# Patient Record
Sex: Male | Born: 1960 | Race: White | Hispanic: No | Marital: Married | Smoking: Former smoker
Health system: Southern US, Community
[De-identification: ages and names within clinical notes are randomized; demographics above are authoritative.]

## PROBLEM LIST (undated history)

## (undated) DIAGNOSIS — E119 Type 2 diabetes mellitus without complications: Secondary | ICD-10-CM

## (undated) DIAGNOSIS — Z87442 Personal history of urinary calculi: Secondary | ICD-10-CM

## (undated) DIAGNOSIS — F32A Depression, unspecified: Secondary | ICD-10-CM

## (undated) DIAGNOSIS — F329 Major depressive disorder, single episode, unspecified: Secondary | ICD-10-CM

## (undated) DIAGNOSIS — M1990 Unspecified osteoarthritis, unspecified site: Secondary | ICD-10-CM

## (undated) HISTORY — PX: BACK SURGERY: SHX140

---
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
## 2019-09-19 ENCOUNTER — Ambulatory Visit: Payer: Self-pay | Admitting: Orthopedic Surgery

## 2019-10-07 ENCOUNTER — Ambulatory Visit: Payer: Self-pay | Admitting: Orthopedic Surgery

## 2019-10-07 NOTE — H&P (Deleted)
  The note originally documented on this encounter has been moved the the encounter in which it belongs.  

## 2019-10-07 NOTE — H&P (Signed)
Subjective:   Ryan Leblanc is a very pleasant 58 year old gentleman Past medical history significant for diabetes (last A1c was around 6), Tobacco use disorder who 4 years ago had a left L3-4 discectomy and has done very well. Approximately 3 months ago he had a work injury that resulted in severe back buttock and right radicular leg pain. Despite conservative treatment measures including physical therapy the patient continues to have severe, debilitating pain and would like to move forward with surgical intervention. He is scheduled for TLIF L3-4 on 10/16/19 at [facility].  Reviewed Past Medical History Chronic Back Pain: Y Diabetes: Y   Current Outpatient Medications  Medication Sig Dispense Refill Last Dose  . Canagliflozin-metFORMIN HCl ER (INVOKAMET XR) [phone] MG TB24 Take 2 tablets by mouth daily.     . Dulaglutide (TRULICITY) 0.75 MG/0.5ML SOPN Inject 0.75 mg into the skin every Monday.     . gabapentin (NEURONTIN) 300 MG capsule Take 300-600 mg by mouth See admin instructions. Take 1 capsule (300 mg) by mouth in the morning, take 1 capsule (300 mg) by mouth in the afternoon, & take 2 capsules (600 mg) by mouth at night.     . naproxen (NAPROSYN) 500 MG tablet Take 500 mg by mouth 2 (two) times daily.     Marland Kitchen OVER THE COUNTER MEDICATION Take 1 drop by mouth daily as needed (arthritis pain). doTERRA Essential Oils (Lemon)      . tiZANidine (ZANAFLEX) 4 MG tablet Take 4 mg by mouth 3 (three) times daily.      No current facility-administered medications for this visit.   No Known Allergies  Social History   Tobacco Use  . Smoking status: Not on file  Substance Use Topics  . Alcohol use: Not on file    Reviewed Family History Mother - Diabetes mellitus Father - History of carcinoma  Review of Systems I stated in HPI.  Objective:   Clinical exam: Ryan Leblanc is a pleasant individual, who appears younger than their stated age. He Is alert and orientated 3. No shortness of breath, chest pain.  Abdomen is soft and non-tender, negative loss of bowel and bladder control, no rebound tenderness. Negative: skin lesions abrasions contusions Peripheral pulses: 2+ dorsalis pedis/posterior tibialis pulses bilaterally. Compartment soft and nontender. Gait pattern: Abnormal gait pattern secondary to right radicular leg pain Assistive devices: Cane Neuro: Positive right femoral stretch test. No focal motor deficits on clinical exam. Positive numbness and dysesthesias in the right L3 dermatome. Negative Babinski test, 1+ deep tendon reflexes at the knee and Achilles.  Moderate to severe back pain radiating into the right groin and thigh. Significant pain with range of motion of the lumbar spine. No hip, knee, ankle pain with isolated joint range of motion  X-rays of the lumbar spine demonstrates mild degenerative disc disease at L3 4 but no spinal listhesis or spondylolysis.  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 Results in significant L3 neural compression. No other significant findings are noted.  Assessment:    Ryan Leblanc is a very pleasant 58 year old gentleman who 4 years ago had a left L3-4 discectomy and has done very well. Recently he had a work injury that resulted in severe back buttock and right radicular leg pain. 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  I have gone over the MRI  and x-rays with the patient and his wife and address the pathology. They both expressed an understanding. We have discussed surgical and nonsurgical solutions for this. Surgical solution would be a transforaminal lumbar interbody fusion at L3-4. This would allow decompression of the disc herniation and then stabilization of the level. He is or he had a previous surgery on the contralateral side and my  concern with just doing a discectomy is that he would be left with worsening back pain and possible iatrogenic instability.  I have discussed the risks of surgery and they have expressed an understanding. Risks and benefits of surgery were discussed with the patient. These include: Infection, bleeding, death, stroke, paralysis, ongoing or worse pain, need for additional surgery, nonunion, leak of spinal fluid, adjacent segment degeneration requiring additional fusion surgery, Injury to abdominal vessels that can require anterior surgery to stop bleeding. Malposition of the cage and/or pedicle screws that could require additional surgery. Loss of bowel and bladder control. Postoperative hematoma causing neurologic compression that could require urgent or emergent re-operation.    Plan:   L3-4 TLIF Pending preoperative clearance from his primary care provider and preoperative testing at [facility].  I have reviewed the patient's medication list with him. I have advised him to stop taking naproxen one week prior to surgery and avoid it for 2 weeks postoperatively. He did express an understanding of this. He is not on any blood thinners. He is not on any aspirin.  At this point, we have not yet obtained preoperative clearance from his primary care provider. I did send a message to [facility] our surgical scheduler to have her fax the clearance form.  Patient was provided LSO brace at today's visit.  We have also discussed the post-operative recovery period to include: bathing/showering restrictions, wound healing, activity (and driving) restrictions, medications/pain mangement.  We have also discussed post-operative redflags to include: signs and symptoms of postoperative infection, DVT/PE.  We have also discussed the goals of surgery to include:  Goals of surgery: Reduction in pain, and improvement in quality of life.  All patients questions were invited and answered.  Follow-up: 2 weeks  postoperatively

## 2019-10-13 ENCOUNTER — Encounter (HOSPITAL_COMMUNITY): Payer: Self-pay

## 2019-10-13 ENCOUNTER — Other Ambulatory Visit (HOSPITAL_COMMUNITY)
Admission: RE | Admit: 2019-10-13 | Discharge: 2019-10-13 | Disposition: A | Discharge status: Home / Self Care | Payer: BC Managed Care – PPO | Source: Ambulatory Visit | Attending: Orthopedic Surgery | Admitting: Orthopedic Surgery

## 2019-10-13 ENCOUNTER — Ambulatory Visit: Payer: Self-pay | Admitting: Orthopedic Surgery

## 2019-10-13 ENCOUNTER — Encounter (HOSPITAL_COMMUNITY)
Admission: RE | Admit: 2019-10-13 | Discharge: 2019-10-13 | Disposition: A | Discharge status: Home / Self Care | Payer: Worker's Compensation | Source: Ambulatory Visit | Attending: Orthopedic Surgery | Admitting: Orthopedic Surgery

## 2019-10-13 ENCOUNTER — Other Ambulatory Visit: Payer: Self-pay

## 2019-10-13 DIAGNOSIS — Z01818 Encounter for other preprocedural examination: Secondary | ICD-10-CM | POA: Diagnosis not present

## 2019-10-13 DIAGNOSIS — Z20828 Contact with and (suspected) exposure to other viral communicable diseases: Secondary | ICD-10-CM | POA: Insufficient documentation

## 2019-10-13 DIAGNOSIS — E119 Type 2 diabetes mellitus without complications: Secondary | ICD-10-CM | POA: Diagnosis not present

## 2019-10-13 DIAGNOSIS — R9431 Abnormal electrocardiogram [ECG] [EKG]: Secondary | ICD-10-CM | POA: Insufficient documentation

## 2019-10-13 DIAGNOSIS — Z01812 Encounter for preprocedural laboratory examination: Secondary | ICD-10-CM | POA: Diagnosis not present

## 2019-10-13 DIAGNOSIS — J984 Other disorders of lung: Secondary | ICD-10-CM | POA: Diagnosis not present

## 2019-10-13 HISTORY — DX: Type 2 diabetes mellitus without complications: E11.9

## 2019-10-13 HISTORY — DX: Depression, unspecified: F32.A

## 2019-10-13 HISTORY — DX: Personal history of urinary calculi: Z87.442

## 2019-10-13 HISTORY — DX: Unspecified osteoarthritis, unspecified site: M19.90

## 2019-10-13 LAB — HEMOGLOBIN A1C
Hgb A1c MFr Bld: 6.6 % — ABNORMAL HIGH (ref 4.8–5.6)
Mean Plasma Glucose: 142.72 mg/dL

## 2019-10-13 LAB — GLUCOSE, CAPILLARY: Glucose-Capillary: 291 mg/dL — ABNORMAL HIGH (ref 70–99)

## 2019-10-13 LAB — BASIC METABOLIC PANEL
Anion gap: 10 (ref 5–15)
BUN: 13 mg/dL (ref 6–20)
CO2: 22 mmol/L (ref 22–32)
Calcium: 9.2 mg/dL (ref 8.9–10.3)
Chloride: 103 mmol/L (ref 98–111)
Creatinine, Ser: 1.14 mg/dL (ref 0.61–1.24)
GFR calc Af Amer: >60 mL/min (ref >60)
GFR calc non Af Amer: >60 mL/min (ref >60)
Glucose, Bld: 270 mg/dL — ABNORMAL HIGH (ref 70–99)
Potassium: 4.4 mmol/L (ref 3.5–5.1)
Sodium: 135 mmol/L (ref 135–145)

## 2019-10-13 LAB — SURGICAL PCR SCREEN
MRSA, PCR: NEGATIVE
Staphylococcus aureus: POSITIVE — AB

## 2019-10-13 LAB — CBC
HCT: 46.7 % (ref 39.0–52.0)
Hemoglobin: 15.2 g/dL (ref 13.0–17.0)
MCH: 28.5 pg (ref 26.0–34.0)
MCHC: 32.5 g/dL (ref 30.0–36.0)
MCV: 87.6 fL (ref 80.0–100.0)
Platelets: 141 10*3/uL — ABNORMAL LOW (ref 150–400)
RBC: 5.33 MIL/uL (ref 4.22–5.81)
RDW: 13.2 % (ref 11.5–15.5)
WBC: 9.9 10*3/uL (ref 4.0–10.5)
nRBC: 0 % (ref 0.0–0.2)

## 2019-10-13 LAB — TYPE AND SCREEN
ABO/RH(D): O NEG
Antibody Screen: NEGATIVE

## 2019-10-13 LAB — ABO/RH: ABO/RH(D): O NEG

## 2019-10-13 NOTE — Progress Notes (Signed)
PCP - Charisse Klinefelter, MD Cardiologist - Denies  PPM/ICD - Denies  Chest x-ray - N/A EKG - 10/13/2019 Stress Test - Denies ECHO - Denies Cardiac Cath - Denies  Sleep Study - Denies  Fasting Blood Sugar - 140-150 Checks Blood Sugar 2 times weekly  Blood Thinner Instructions: N/A Aspirin Instructions: N/A  ERAS Protcol - No  COVID TEST- 10/13/2019   Anesthesia review: Yes, Per pre-op order?  Patient denies shortness of breath, fever, cough and chest pain at PAT appointment  Coronavirus Screening  Have you experienced the following symptoms:  Cough yes/no: No Fever (>100.79F)  yes/no: No Runny nose yes/no: No Sore throat yes/no: No Difficulty breathing/shortness of breath  yes/no: No  Have you or a family member traveled in the last 14 days and where? yes/no: No   If the patient indicates "YES" to the above questions, their PAT will be rescheduled to limit the exposure to others and, the surgeon will be notified. THE PATIENT WILL NEED TO BE ASYMPTOMATIC FOR 14 DAYS.   If the patient is not experiencing any of these symptoms, the PAT nurse will instruct them to NOT bring anyone with them to their appointment since they may have these symptoms or traveled as well.   Please remind your patients and families that hospital visitation restrictions are in effect and the importance of the restrictions.    All instructions explained to the patient, with a verbal understanding of the material. Patient agrees to go over the instructions while at home for a better understanding. Patient also instructed to self quarantine after being tested for COVID-19. The opportunity to ask questions was provided.

## 2019-10-13 NOTE — Pre-Procedure Instructions (Signed)
Ryan Leblanc  10/13/2019      [facility] [address], [place] - [address] [place] [address] Phone: [phone] Fax: [phone]    Your procedure is scheduled on Thursday, December 31  Report to [facility], Main Entrance or Entrance "A"at 8:00 AM                  Your surgery or procedure is scheduled for  10:00 A.M.   Call this number if you have problems the morning of surgery: [phone]  This is the number for the Pre- Surgical Desk.                 For any other questions, please call [phone], Monday - Friday 8 AM - 4 PM.   Remember:  Do not eat or drink after midnight Wednesday, December 30.    Take these medicines the morning of surgery with A SIP OF WATER : gabapentin (NEURONTIN) tiZANidine (ZANAFLEX) if needed.   STOP taking Aspirin, Aspirin Products (Goody Powder, Excedrin Migraine), Ibuprofen (Advil), Naproxen (Aleve), Vitamins and Herbal Products (ie Fish Oil).   WHAT DO I DO ABOUT MY DIABETES MEDICATION?      Do NOT take Canagliflozin-metFORMIN HCl ER (INVOKAMET XR)  Wednesday, 10/15/2019 or the morning of surgery  . Do not take oral diabetes medicines (pills) the morning of surgery. .  How to Manage Your Diabetes Before and After Surgery  Why is it important to control my blood sugar before and after surgery? . Improving blood sugar levels before and after surgery helps healing and can limit problems. . A way of improving blood sugar control is eating a healthy diet by: o  Eating less sugar and carbohydrates o  Increasing activity/exercise o  Talking with your doctor about reaching your blood sugar goals . High blood sugars (greater than 180 mg/dL) can raise your risk of infections and slow your recovery, so you will need to focus on controlling your diabetes during the weeks before surgery. . Make sure that the doctor who takes care of your diabetes knows about your planned surgery including the date and location.  How  do I manage my blood sugar before surgery? . Check your blood sugar at least 4 times a day, starting 2 days before surgery, to make sure that the level is not too high or low. o Check your blood sugar the morning of your surgery when you wake up and every 2 hours until you get to the Short Stay unit. . If your blood sugar is less than 70 mg/dL, you will need to treat for low blood sugar: o Do not take insulin. o Treat a low blood sugar (less than 70 mg/dL) with  cup of clear juice (cranberry or apple), 4 glucose tablets, OR glucose gel. o Recheck blood sugar in 15 minutes after treatment (to make sure it is greater than 70 mg/dL). If your blood sugar is not greater than 70 mg/dL on recheck, call  for further instructions [phone] . Report your blood sugar to the short stay nurse when you get to Short Stay.  . If you are admitted to the hospital after surgery: o Your blood sugar will be checked by the staff and you will probably be given insulin after surgery (instead of oral diabetes medicines) to make sure you have good blood sugar levels. o The goal for blood sugar control after surgery is 80-180 mg/dL.   Morning of surgery:  Do not  wear jewelry, make-up or nail polish.  Do not wear lotions, powders, or perfumes, or deodorant.  Do not shave 48 hours prior to surgery.  Men may shave face and neck.  Do not bring valuables to the hospital.  [facility] is not responsible for any belongings or valuables.  Special instructions:  [place]- Preparing For Surgery  Before surgery, you can play an important role. Because skin is not sterile, your skin needs to be as free of germs as possible. You can reduce the number of germs on your skin by washing with CHG (chlorahexidine gluconate) Soap before surgery.  CHG is an antiseptic cleaner which kills germs and bonds with the skin to continue killing germs even after washing.    Oral Hygiene is also important to reduce your risk of infection.   Remember - BRUSH YOUR TEETH THE MORNING OF SURGERY WITH YOUR REGULAR TOOTHPASTE  Please do not use if you have an allergy to CHG or antibacterial soaps. If your skin becomes reddened/irritated stop using the CHG.  Do not shave (including legs and underarms) for at least 48 hours prior to first CHG shower. It is OK to shave your face.  Please follow these instructions carefully.   1. Shower the NIGHT BEFORE SURGERY and the MORNING OF SURGERY with CHG.   2. If you chose to wash your hair, wash your hair first as usual with your normal shampoo.  3. After you shampoo,wash your face and private area with the soap you use at home, then rinse your hair and body thoroughly to remove the shampoo and soap.  4. Use CHG as you would any other liquid soap.   5. Apply the CHG Soap to your body ONLY FROM THE NECK DOWN.  Do not use on open wounds or open sores. Avoid contact with your eyes, ears, mouth and genitals (private parts). Wash Face and genitals (private parts)  with your normal soap.  6. Wash thoroughly, paying special attention to the area where your surgery will be performed.  7. Thoroughly rinse your body with warm water from the neck down.  8. DO NOT shower/wash with your normal soap after using and rinsing off the CHG Soap.  9. Pat yourself dry with a CLEAN TOWEL.  10. Wear CLEAN PAJAMAS to bed the night before surgery, wear comfortable clothes the morning of surgery  11. Place CLEAN SHEETS on your bed the night of your first shower and DO NOT SLEEP WITH PETS.  Day of Surgery: Shower as instructed above. Do not apply any deodorants/lotions.  Please wear clean clothes to the hospital/surgery center.   Remember to brush your teeth WITH YOUR REGULAR TOOTHPASTE.    Contacts, dentures or bridgework may not be worn into surgery.  Leave your suitcase in the car.  After surgery it may be brought to your room.  For patients admitted to the hospital, discharge time will be determined by  your treatment team.  Patients discharged the day of surgery will not be allowed to drive home.   Please read over the following fact sheets that you were given.

## 2019-10-13 NOTE — Progress Notes (Signed)
IMB Dr. Rolena Infante to sign procedure orders.

## 2019-10-13 NOTE — Anesthesia Preprocedure Evaluation (Addendum)
Anesthesia Evaluation  Patient identified by MRN, date of birth, ID band Patient awake    Reviewed: Allergy & Precautions, NPO status , Patient's Chart, lab work & pertinent test results  Airway Mallampati: II  TM Distance: >3 FB Neck ROM: Full    Dental no notable dental hx. (+) Teeth Intact, Dental Advisory Given   Pulmonary neg pulmonary ROS, former smoker,    Pulmonary exam normal breath sounds clear to auscultation       Cardiovascular Exercise Tolerance: Good negative cardio ROS Normal cardiovascular exam Rhythm:Regular Rate:Normal     Neuro/Psych negative neurological ROS  negative psych ROS   GI/Hepatic negative GI ROS, Neg liver ROS,   Endo/Other  diabetes, Well Controlled, Type 2, Oral Hypoglycemic Agents  Renal/GU negative Renal ROSK+ 4.4 Cr 1.14     Musculoskeletal  (+) Arthritis ,   Abdominal (+) + obese,   Peds  Hematology negative hematology ROS (+) Hgb 15.2 Plt 141   Anesthesia Other Findings   Reproductive/Obstetrics negative OB ROS                           Anesthesia Physical Anesthesia Plan  ASA: III  Anesthesia Plan: General   Post-op Pain Management:    Induction: Intravenous  PONV Risk Score and Plan: 3 and Treatment may vary due to age or medical condition, Ondansetron, Dexamethasone and Midazolam  Airway Management Planned: Oral ETT  Additional Equipment: None  Intra-op Plan:   Post-operative Plan: Extubation in OR  Informed Consent:     Dental advisory given  Plan Discussed with:   Anesthesia Plan Comments: (Cleared by PCP 10/08/19, per note in care everywhere pt able to perform > 4 METs activity.  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 Echo reportedly normal and cleared for surgery. Full results of echo not available, however  anesthesia note in care everywhere from 05/26/2016 states "TTE: EF 53%, o/w normal."  DMII well controlled, A1c 6.6 10/13/19. CBG however was 291. Platelets very mildly low at 141. Remainder of labs WNL.   EKG 10/13/19: Normal sinus rhythm. Rate 75. Left axis deviation. Pulmonary disease pattern. Inferior infarct , age undetermined)      Anesthesia Quick Evaluation

## 2019-10-14 LAB — NOVEL CORONAVIRUS, NAA (HOSP ORDER, SEND-OUT TO REF LAB; TAT 18-24 HRS): SARS-CoV-2, NAA: NOT DETECTED

## 2019-10-14 NOTE — Progress Notes (Signed)
Anesthesia Chart Review:  Cleared by PCP 10/08/19, per note in care everywhere pt able to perform > 4 METs activity.  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 Echo reportedly normal and cleared for surgery. Full results of echo not available, however anesthesia note in care everywhere from 05/26/2016 states "TTE: EF 53%, o/w normal."  DMII well controlled, A1c 6.6 10/13/19. CBG however was 291. Platelets very mildly low at 141. Remainder of labs WNL.   EKG 10/13/19: Normal sinus rhythm. Rate 75. Left axis deviation. Pulmonary disease pattern. Inferior infarct , age undetermined  Wynonia Musty [facility] Short Stay Center/Anesthesiology Phone [phone] 10/14/2019 1:07 PM

## 2019-10-16 ENCOUNTER — Other Ambulatory Visit: Payer: Self-pay

## 2019-10-16 ENCOUNTER — Encounter (HOSPITAL_COMMUNITY): Payer: Self-pay | Admitting: Orthopedic Surgery

## 2019-10-16 ENCOUNTER — Inpatient Hospital Stay (HOSPITAL_COMMUNITY): Payer: Worker's Compensation | Attending: Orthopedic Surgery

## 2019-10-16 ENCOUNTER — Inpatient Hospital Stay (HOSPITAL_COMMUNITY)
Admission: RE | Admit: 2019-10-16 | Discharge: 2019-10-17 | DRG: 460 | Disposition: A | Discharge status: Home / Self Care | Payer: Worker's Compensation | Attending: Orthopedic Surgery | Admitting: Orthopedic Surgery

## 2019-10-16 ENCOUNTER — Inpatient Hospital Stay (HOSPITAL_COMMUNITY): Payer: Worker's Compensation | Admitting: Physician Assistant

## 2019-10-16 ENCOUNTER — Inpatient Hospital Stay (HOSPITAL_COMMUNITY)
Admission: RE | Disposition: A | Discharge status: Home / Self Care | Payer: Self-pay | Source: Home / Self Care | Attending: Orthopedic Surgery

## 2019-10-16 ENCOUNTER — Inpatient Hospital Stay (HOSPITAL_COMMUNITY): Payer: Worker's Compensation | Admitting: Anesthesiology

## 2019-10-16 DIAGNOSIS — Z6836 Body mass index (BMI) 36.0-36.9, adult: Secondary | ICD-10-CM | POA: Diagnosis not present

## 2019-10-16 DIAGNOSIS — M5116 Intervertebral disc disorders with radiculopathy, lumbar region: Secondary | ICD-10-CM | POA: Diagnosis present

## 2019-10-16 DIAGNOSIS — Z7984 Long term (current) use of oral hypoglycemic drugs: Secondary | ICD-10-CM | POA: Diagnosis not present

## 2019-10-16 DIAGNOSIS — M961 Postlaminectomy syndrome, not elsewhere classified: Principal | ICD-10-CM | POA: Diagnosis present

## 2019-10-16 DIAGNOSIS — Z833 Family history of diabetes mellitus: Secondary | ICD-10-CM

## 2019-10-16 DIAGNOSIS — J984 Other disorders of lung: Secondary | ICD-10-CM | POA: Diagnosis present

## 2019-10-16 DIAGNOSIS — E669 Obesity, unspecified: Secondary | ICD-10-CM | POA: Diagnosis present

## 2019-10-16 DIAGNOSIS — M48061 Spinal stenosis, lumbar region without neurogenic claudication: Secondary | ICD-10-CM | POA: Diagnosis present

## 2019-10-16 DIAGNOSIS — Z01818 Encounter for other preprocedural examination: Secondary | ICD-10-CM | POA: Diagnosis not present

## 2019-10-16 DIAGNOSIS — M545 Low back pain: Secondary | ICD-10-CM | POA: Diagnosis present

## 2019-10-16 DIAGNOSIS — E119 Type 2 diabetes mellitus without complications: Secondary | ICD-10-CM | POA: Diagnosis present

## 2019-10-16 DIAGNOSIS — Z87891 Personal history of nicotine dependence: Secondary | ICD-10-CM | POA: Diagnosis not present

## 2019-10-16 DIAGNOSIS — R9431 Abnormal electrocardiogram [ECG] [EKG]: Secondary | ICD-10-CM | POA: Diagnosis present

## 2019-10-16 DIAGNOSIS — Z20822 Contact with and (suspected) exposure to covid-19: Secondary | ICD-10-CM | POA: Diagnosis present

## 2019-10-16 DIAGNOSIS — Z419 Encounter for procedure for purposes other than remedying health state, unspecified: Secondary | ICD-10-CM

## 2019-10-16 DIAGNOSIS — M4327 Fusion of spine, lumbosacral region: Secondary | ICD-10-CM | POA: Diagnosis present

## 2019-10-16 HISTORY — PX: TRANSFORAMINAL LUMBAR INTERBODY FUSION (TLIF) WITH PEDICLE SCREW FIXATION 1 LEVEL: SHX6141

## 2019-10-16 LAB — GLUCOSE, CAPILLARY
Glucose-Capillary: 187 mg/dL — ABNORMAL HIGH (ref 70–99)
Glucose-Capillary: 315 mg/dL — ABNORMAL HIGH (ref 70–99)
Glucose-Capillary: 315 mg/dL — ABNORMAL HIGH (ref 70–99)
Glucose-Capillary: 324 mg/dL — ABNORMAL HIGH (ref 70–99)
Glucose-Capillary: 289 mg/dL — ABNORMAL HIGH (ref 70–99)

## 2019-10-16 LAB — POCT I-STAT EG7
Acid-base deficit: 3 mmol/L — ABNORMAL HIGH (ref 0.0–2.0)
Bicarbonate: 24.1 mmol/L (ref 20.0–28.0)
Calcium, Ion: 1.15 mmol/L (ref 1.15–1.40)
HCT: 36 % — ABNORMAL LOW (ref 39.0–52.0)
Hemoglobin: 12.2 g/dL — ABNORMAL LOW (ref 13.0–17.0)
O2 Saturation: 76 %
Potassium: 5.3 mmol/L — ABNORMAL HIGH (ref 3.5–5.1)
Sodium: 137 mmol/L (ref 135–145)
TCO2: 26 mmol/L (ref 22–32)
pCO2, Ven: 52.8 mmHg (ref 44.0–60.0)
pH, Ven: 7.267 (ref 7.250–7.430)
pO2, Ven: 47 mmHg — ABNORMAL HIGH (ref 32.0–45.0)

## 2019-10-16 SURGERY — TRANSFORAMINAL LUMBAR INTERBODY FUSION (TLIF) WITH PEDICLE SCREW FIXATION 1 LEVEL
Anesthesia: General

## 2019-10-16 MED ORDER — METHOCARBAMOL 1000 MG/10ML IJ SOLN
500.0000 mg | Freq: Four times a day (QID) | INTRAVENOUS | Status: DC | PRN
  Filled 2019-10-16: qty 5

## 2019-10-16 MED ORDER — LIDOCAINE 2% (20 MG/ML) 5 ML SYRINGE
INTRAMUSCULAR | Status: AC
  Filled 2019-10-16: qty 10

## 2019-10-16 MED ORDER — CEFAZOLIN SODIUM-DEXTROSE 2-4 GM/100ML-% IV SOLN
INTRAVENOUS | Status: AC
  Filled 2019-10-16: qty 100

## 2019-10-16 MED ORDER — ONDANSETRON HCL 4 MG PO TABS: 4.0000 mg | ORAL_TABLET | Freq: Four times a day (QID) | ORAL | Status: DC | PRN

## 2019-10-16 MED ORDER — INSULIN ASPART 100 UNIT/ML ~~LOC~~ SOLN
0.0000 [IU] | Freq: Three times a day (TID) | SUBCUTANEOUS | Status: DC
  Administered 2019-10-16: 11 [IU] via SUBCUTANEOUS
  Administered 2019-10-17: 3 [IU] via SUBCUTANEOUS

## 2019-10-16 MED ORDER — SUCCINYLCHOLINE CHLORIDE 200 MG/10ML IV SOSY
PREFILLED_SYRINGE | INTRAVENOUS | Status: AC
  Filled 2019-10-16: qty 10

## 2019-10-16 MED ORDER — METFORMIN HCL ER 750 MG PO TB24
2000.0000 mg | ORAL_TABLET | Freq: Every day | ORAL | Status: DC
  Filled 2019-10-16: qty 1

## 2019-10-16 MED ORDER — PROPOFOL 10 MG/ML IV BOLUS
INTRAVENOUS | Status: AC
  Filled 2019-10-16: qty 20

## 2019-10-16 MED ORDER — MIDAZOLAM HCL 2 MG/2ML IJ SOLN
INTRAMUSCULAR | Status: AC
  Filled 2019-10-16: qty 2

## 2019-10-16 MED ORDER — HEPARIN SODIUM (PORCINE) 10000 UNIT/ML IJ SOLN
INTRAMUSCULAR | Status: DC | PRN
  Administered 2019-10-16: 10000 [IU]

## 2019-10-16 MED ORDER — ONDANSETRON HCL 4 MG/2ML IJ SOLN: 4.0000 mg | Freq: Once | INTRAMUSCULAR | Status: DC | PRN

## 2019-10-16 MED ORDER — SUCCINYLCHOLINE CHLORIDE 20 MG/ML IJ SOLN
INTRAMUSCULAR | Status: DC | PRN
  Administered 2019-10-16: 100 mg via INTRAVENOUS

## 2019-10-16 MED ORDER — LABETALOL HCL 5 MG/ML IV SOLN
INTRAVENOUS | Status: DC | PRN
  Administered 2019-10-16: 5 mg via INTRAVENOUS

## 2019-10-16 MED ORDER — FENTANYL CITRATE (PF) 250 MCG/5ML IJ SOLN
INTRAMUSCULAR | Status: AC
  Filled 2019-10-16: qty 5

## 2019-10-16 MED ORDER — OXYCODONE-ACETAMINOPHEN 10-325 MG PO TABS: 1.0000 | ORAL_TABLET | Freq: Four times a day (QID) | ORAL | 0 refills | Status: AC | PRN

## 2019-10-16 MED ORDER — THROMBIN (RECOMBINANT) 20000 UNITS EX SOLR
CUTANEOUS | Status: AC
  Filled 2019-10-16: qty 20000

## 2019-10-16 MED ORDER — MEPERIDINE HCL 25 MG/ML IJ SOLN: 6.2500 mg | INTRAMUSCULAR | Status: DC | PRN

## 2019-10-16 MED ORDER — LACTATED RINGERS IV SOLN: INTRAVENOUS | Status: DC

## 2019-10-16 MED ORDER — METHOCARBAMOL 500 MG PO TABS
500.0000 mg | ORAL_TABLET | Freq: Four times a day (QID) | ORAL | Status: DC | PRN
  Administered 2019-10-16 – 2019-10-17 (×3): 500 mg via ORAL
  Filled 2019-10-16 (×3): qty 1

## 2019-10-16 MED ORDER — METHOCARBAMOL 500 MG PO TABS: 500.0000 mg | ORAL_TABLET | Freq: Three times a day (TID) | ORAL | 0 refills | Status: AC | PRN

## 2019-10-16 MED ORDER — POLYETHYLENE GLYCOL 3350 17 G PO PACK: 17.0000 g | PACK | Freq: Every day | ORAL | Status: DC | PRN

## 2019-10-16 MED ORDER — OXYCODONE HCL 5 MG/5ML PO SOLN: 5.0000 mg | Freq: Once | ORAL | Status: DC | PRN

## 2019-10-16 MED ORDER — ACETAMINOPHEN 10 MG/ML IV SOLN
INTRAVENOUS | Status: AC
  Administered 2019-10-16: 1000 mg via INTRAVENOUS
  Filled 2019-10-16: qty 100

## 2019-10-16 MED ORDER — PHENYLEPHRINE HCL (PRESSORS) 10 MG/ML IV SOLN
INTRAVENOUS | Status: DC | PRN
  Administered 2019-10-16: 120 ug via INTRAVENOUS

## 2019-10-16 MED ORDER — ARTIFICIAL TEARS OPHTHALMIC OINT
TOPICAL_OINTMENT | OPHTHALMIC | Status: AC
  Filled 2019-10-16: qty 7

## 2019-10-16 MED ORDER — HYDROMORPHONE HCL 1 MG/ML IJ SOLN
INTRAMUSCULAR | Status: AC
  Filled 2019-10-16: qty 0.5

## 2019-10-16 MED ORDER — KETAMINE HCL 50 MG/5ML IJ SOSY
PREFILLED_SYRINGE | INTRAMUSCULAR | Status: AC
  Filled 2019-10-16: qty 5

## 2019-10-16 MED ORDER — MORPHINE SULFATE (PF) 2 MG/ML IV SOLN: 2.0000 mg | INTRAVENOUS | Status: DC | PRN

## 2019-10-16 MED ORDER — GABAPENTIN 300 MG PO CAPS
300.0000 mg | ORAL_CAPSULE | Freq: Two times a day (BID) | ORAL | Status: DC
  Administered 2019-10-16 – 2019-10-17 (×2): 300 mg via ORAL
  Filled 2019-10-16 (×2): qty 1

## 2019-10-16 MED ORDER — 0.9 % SODIUM CHLORIDE (POUR BTL) OPTIME
TOPICAL | Status: DC | PRN
  Administered 2019-10-16: 09:00:00 3000 mL

## 2019-10-16 MED ORDER — SODIUM CHLORIDE 0.9% FLUSH: 3.0000 mL | INTRAVENOUS | Status: DC | PRN

## 2019-10-16 MED ORDER — OXYCODONE HCL 5 MG PO TABS: 5.0000 mg | ORAL_TABLET | Freq: Once | ORAL | Status: DC | PRN

## 2019-10-16 MED ORDER — ACETAMINOPHEN 500 MG PO TABS
1000.0000 mg | ORAL_TABLET | Freq: Once | ORAL | Status: AC
  Administered 2019-10-16: 1000 mg via ORAL
  Filled 2019-10-16: qty 2

## 2019-10-16 MED ORDER — ARTIFICIAL TEARS OPHTHALMIC OINT
TOPICAL_OINTMENT | OPHTHALMIC | Status: DC | PRN
  Administered 2019-10-16: 1 via OPHTHALMIC

## 2019-10-16 MED ORDER — ACETAMINOPHEN 10 MG/ML IV SOLN: 1000.0000 mg | Freq: Once | INTRAVENOUS | Status: DC | PRN

## 2019-10-16 MED ORDER — HEPARIN SODIUM (PORCINE) 1000 UNIT/ML IJ SOLN
INTRAMUSCULAR | Status: AC
  Filled 2019-10-16: qty 1

## 2019-10-16 MED ORDER — TRANEXAMIC ACID-NACL 1000-0.7 MG/100ML-% IV SOLN
INTRAVENOUS | Status: DC | PRN
  Administered 2019-10-16: 1000 mg via INTRAVENOUS

## 2019-10-16 MED ORDER — DULAGLUTIDE 0.75 MG/0.5ML ~~LOC~~ SOAJ: 0.7500 mg | SUBCUTANEOUS | Status: DC

## 2019-10-16 MED ORDER — KETAMINE HCL 10 MG/ML IJ SOLN
INTRAMUSCULAR | Status: DC | PRN
  Administered 2019-10-16: 10 mg via INTRAVENOUS
  Administered 2019-10-16: 30 mg via INTRAVENOUS
  Administered 2019-10-16: 10 mg via INTRAVENOUS

## 2019-10-16 MED ORDER — PROPOFOL 500 MG/50ML IV EMUL
INTRAVENOUS | Status: DC | PRN
  Administered 2019-10-16: 50 ug/kg/min via INTRAVENOUS

## 2019-10-16 MED ORDER — HYDROMORPHONE HCL 1 MG/ML IJ SOLN
0.2500 mg | INTRAMUSCULAR | Status: DC | PRN
  Administered 2019-10-16: 0.5 mg via INTRAVENOUS

## 2019-10-16 MED ORDER — HEMOSTATIC AGENTS (NO CHARGE) OPTIME
TOPICAL | Status: DC | PRN
  Administered 2019-10-16: 1 via TOPICAL

## 2019-10-16 MED ORDER — PROPOFOL 10 MG/ML IV BOLUS
INTRAVENOUS | Status: DC | PRN
  Administered 2019-10-16 (×2): 50 mg via INTRAVENOUS
  Administered 2019-10-16: 150 mg via INTRAVENOUS

## 2019-10-16 MED ORDER — EPINEPHRINE PF 1 MG/ML IJ SOLN
INTRAMUSCULAR | Status: AC
  Filled 2019-10-16: qty 1

## 2019-10-16 MED ORDER — PROPOFOL 500 MG/50ML IV EMUL
INTRAVENOUS | Status: AC
  Filled 2019-10-16: qty 150

## 2019-10-16 MED ORDER — ONDANSETRON HCL 4 MG PO TABS: 4.0000 mg | ORAL_TABLET | Freq: Three times a day (TID) | ORAL | 0 refills | Status: AC | PRN

## 2019-10-16 MED ORDER — PROPOFOL 1000 MG/100ML IV EMUL
INTRAVENOUS | Status: AC
  Filled 2019-10-16: qty 100

## 2019-10-16 MED ORDER — ONDANSETRON HCL 4 MG/2ML IJ SOLN: 4.0000 mg | Freq: Four times a day (QID) | INTRAMUSCULAR | Status: DC | PRN

## 2019-10-16 MED ORDER — CANAGLIFLOZIN 300 MG PO TABS
300.0000 mg | ORAL_TABLET | Freq: Every day | ORAL | Status: DC
  Administered 2019-10-17: 300 mg via ORAL
  Filled 2019-10-16: qty 1

## 2019-10-16 MED ORDER — PROPOFOL 1000 MG/100ML IV EMUL
INTRAVENOUS | Status: AC
  Filled 2019-10-16: qty 300

## 2019-10-16 MED ORDER — CEFAZOLIN SODIUM-DEXTROSE 1-4 GM/50ML-% IV SOLN
1.0000 g | Freq: Three times a day (TID) | INTRAVENOUS | Status: AC
  Administered 2019-10-16 (×2): 1 g via INTRAVENOUS
  Filled 2019-10-16 (×2): qty 50

## 2019-10-16 MED ORDER — GABAPENTIN 300 MG PO CAPS: 300.0000 mg | ORAL_CAPSULE | ORAL | Status: DC

## 2019-10-16 MED ORDER — CANAGLIFLOZIN-METFORMIN HCL ER 150-1000 MG PO TB24: 2.0000 | ORAL_TABLET | Freq: Every day | ORAL | Status: DC

## 2019-10-16 MED ORDER — TRANEXAMIC ACID-NACL 1000-0.7 MG/100ML-% IV SOLN
INTRAVENOUS | Status: AC
  Filled 2019-10-16: qty 100

## 2019-10-16 MED ORDER — INSULIN ASPART 100 UNIT/ML ~~LOC~~ SOLN
SUBCUTANEOUS | Status: AC
  Administered 2019-10-16: 6 [IU] via SUBCUTANEOUS
  Filled 2019-10-16: qty 1

## 2019-10-16 MED ORDER — GABAPENTIN 300 MG PO CAPS
600.0000 mg | ORAL_CAPSULE | Freq: Every day | ORAL | Status: DC
  Administered 2019-10-16: 600 mg via ORAL
  Filled 2019-10-16: qty 2

## 2019-10-16 MED ORDER — MIDAZOLAM HCL 5 MG/5ML IJ SOLN
INTRAMUSCULAR | Status: DC | PRN
  Administered 2019-10-16: 2 mg via INTRAVENOUS

## 2019-10-16 MED ORDER — THROMBIN 20000 UNITS EX SOLR
CUTANEOUS | Status: DC | PRN
  Administered 2019-10-16: 20 mL via TOPICAL

## 2019-10-16 MED ORDER — SODIUM CHLORIDE 0.9% FLUSH
3.0000 mL | Freq: Two times a day (BID) | INTRAVENOUS | Status: DC
  Administered 2019-10-16: 3 mL via INTRAVENOUS

## 2019-10-16 MED ORDER — ONDANSETRON HCL 4 MG/2ML IJ SOLN
INTRAMUSCULAR | Status: DC | PRN
  Administered 2019-10-16: 4 mg via INTRAVENOUS

## 2019-10-16 MED ORDER — DEXAMETHASONE SODIUM PHOSPHATE 10 MG/ML IJ SOLN
INTRAMUSCULAR | Status: AC
  Filled 2019-10-16: qty 1

## 2019-10-16 MED ORDER — FENTANYL CITRATE (PF) 100 MCG/2ML IJ SOLN
INTRAMUSCULAR | Status: DC | PRN
  Administered 2019-10-16 (×2): 50 ug via INTRAVENOUS
  Administered 2019-10-16: 100 ug via INTRAVENOUS
  Administered 2019-10-16 (×4): 50 ug via INTRAVENOUS

## 2019-10-16 MED ORDER — ROCURONIUM BROMIDE 10 MG/ML (PF) SYRINGE
PREFILLED_SYRINGE | INTRAVENOUS | Status: AC
  Filled 2019-10-16: qty 10

## 2019-10-16 MED ORDER — OXYCODONE HCL 5 MG PO TABS
10.0000 mg | ORAL_TABLET | ORAL | Status: DC | PRN
  Administered 2019-10-16 – 2019-10-17 (×5): 10 mg via ORAL
  Filled 2019-10-16 (×5): qty 2

## 2019-10-16 MED ORDER — DOCUSATE SODIUM 100 MG PO CAPS
100.0000 mg | ORAL_CAPSULE | Freq: Two times a day (BID) | ORAL | Status: DC
  Administered 2019-10-16 – 2019-10-17 (×2): 100 mg via ORAL
  Filled 2019-10-16 (×3): qty 1

## 2019-10-16 MED ORDER — MENTHOL 3 MG MT LOZG: 1.0000 | LOZENGE | OROMUCOSAL | Status: DC | PRN

## 2019-10-16 MED ORDER — LACTATED RINGERS IV SOLN: INTRAVENOUS | Status: DC | PRN

## 2019-10-16 MED ORDER — ALBUMIN HUMAN 5 % IV SOLN: INTRAVENOUS | Status: DC | PRN

## 2019-10-16 MED ORDER — CEFAZOLIN SODIUM-DEXTROSE 2-3 GM-%(50ML) IV SOLR
INTRAVENOUS | Status: DC | PRN
  Administered 2019-10-16: 2 g via INTRAVENOUS

## 2019-10-16 MED ORDER — HYDROMORPHONE HCL 1 MG/ML IJ SOLN
INTRAMUSCULAR | Status: AC
  Administered 2019-10-16: 0.5 mg via INTRAVENOUS
  Filled 2019-10-16: qty 1

## 2019-10-16 MED ORDER — BUPIVACAINE-EPINEPHRINE 0.25% -1:200000 IJ SOLN
INTRAMUSCULAR | Status: DC | PRN
  Administered 2019-10-16: 20 mL

## 2019-10-16 MED ORDER — PHENOL 1.4 % MT LIQD: 1.0000 | OROMUCOSAL | Status: DC | PRN

## 2019-10-16 MED ORDER — PHENYLEPHRINE 40 MCG/ML (10ML) SYRINGE FOR IV PUSH (FOR BLOOD PRESSURE SUPPORT)
PREFILLED_SYRINGE | INTRAVENOUS | Status: AC
  Filled 2019-10-16: qty 10

## 2019-10-16 MED ORDER — ACETAMINOPHEN 325 MG PO TABS
650.0000 mg | ORAL_TABLET | ORAL | Status: DC | PRN
  Administered 2019-10-17: 650 mg via ORAL
  Filled 2019-10-16 (×2): qty 2

## 2019-10-16 MED ORDER — MAGNESIUM CITRATE PO SOLN: 1.0000 | Freq: Once | ORAL | Status: DC | PRN

## 2019-10-16 MED ORDER — ACETAMINOPHEN 10 MG/ML IV SOLN
INTRAVENOUS | Status: AC
  Filled 2019-10-16: qty 100

## 2019-10-16 MED ORDER — INSULIN ASPART 100 UNIT/ML ~~LOC~~ SOLN
6.0000 [IU] | Freq: Once | SUBCUTANEOUS | Status: AC
  Administered 2019-10-16: 14:00:00 6 [IU] via SUBCUTANEOUS

## 2019-10-16 MED ORDER — ONDANSETRON HCL 4 MG/2ML IJ SOLN
INTRAMUSCULAR | Status: AC
  Filled 2019-10-16: qty 2

## 2019-10-16 MED ORDER — SODIUM CHLORIDE 0.9 % IV SOLN
INTRAVENOUS | Status: DC | PRN
  Administered 2019-10-16: 08:00:00 50 ug/min via INTRAVENOUS

## 2019-10-16 MED ORDER — INSULIN ASPART 100 UNIT/ML ~~LOC~~ SOLN
0.0000 [IU] | Freq: Every day | SUBCUTANEOUS | Status: DC
  Administered 2019-10-16: 3 [IU] via SUBCUTANEOUS

## 2019-10-16 MED ORDER — LIDOCAINE 2% (20 MG/ML) 5 ML SYRINGE
INTRAMUSCULAR | Status: DC | PRN
  Administered 2019-10-16: 100 mg via INTRAVENOUS

## 2019-10-16 MED ORDER — INSULIN ASPART 100 UNIT/ML ~~LOC~~ SOLN: 6.0000 [IU] | Freq: Once | SUBCUTANEOUS | Status: AC

## 2019-10-16 MED ORDER — HYDROMORPHONE HCL 1 MG/ML IJ SOLN
INTRAMUSCULAR | Status: DC | PRN
  Administered 2019-10-16: .5 mg via INTRAVENOUS

## 2019-10-16 MED ORDER — DEXAMETHASONE SODIUM PHOSPHATE 10 MG/ML IJ SOLN
INTRAMUSCULAR | Status: DC | PRN
  Administered 2019-10-16: 5 mg via INTRAVENOUS

## 2019-10-16 MED ORDER — ACETAMINOPHEN 650 MG RE SUPP: 650.0000 mg | RECTAL | Status: DC | PRN

## 2019-10-16 MED ORDER — BUPIVACAINE HCL (PF) 0.25 % IJ SOLN
INTRAMUSCULAR | Status: AC
  Filled 2019-10-16: qty 30

## 2019-10-16 MED ORDER — OXYCODONE HCL 5 MG PO TABS: 5.0000 mg | ORAL_TABLET | ORAL | Status: DC | PRN

## 2019-10-16 SURGICAL SUPPLY — 77 items
BLADE CLIPPER SURG (BLADE) IMPLANT
BUR EGG ELITE 4.0 (BURR) IMPLANT
CABLE BIPOLOR RESECTION CORD (MISCELLANEOUS) ×2 IMPLANT
CAGE SABLE 10X26 6-12 8D (Cage) ×2 IMPLANT
CANISTER SUCT 3000ML PPV (MISCELLANEOUS) ×2 IMPLANT
CLIP NEUROVISION LG (CLIP) ×2 IMPLANT
CLSR STERI-STRIP ANTIMIC 1/2X4 (GAUZE/BANDAGES/DRESSINGS) ×2 IMPLANT
COVER SURGICAL LIGHT HANDLE (MISCELLANEOUS) ×2 IMPLANT
COVER WAND RF STERILE (DRAPES) ×2 IMPLANT
DRAIN TLS ROUND 10FR (DRAIN) ×2 IMPLANT
DRAPE C-ARM 42X72 X-RAY (DRAPES) ×2 IMPLANT
DRAPE C-ARMOR (DRAPES) ×2 IMPLANT
DRAPE POUCH INSTRU U-SHP 10X18 (DRAPES) ×2 IMPLANT
DRAPE SURG 17X23 STRL (DRAPES) ×2 IMPLANT
DRAPE U-SHAPE 47X51 STRL (DRAPES) ×2 IMPLANT
DRSG OPSITE 4X5.5 SM (GAUZE/BANDAGES/DRESSINGS) ×2 IMPLANT
DRSG OPSITE POSTOP 4X8 (GAUZE/BANDAGES/DRESSINGS) ×2 IMPLANT
DURAPREP 26ML APPLICATOR (WOUND CARE) ×2 IMPLANT
ELECT BLADE 4.0 EZ CLEAN MEGAD (MISCELLANEOUS) ×2
ELECT BLADE 6.5 EXT (BLADE) ×2 IMPLANT
ELECT PENCIL ROCKER SW 15FT (MISCELLANEOUS) ×2 IMPLANT
ELECT REM PT RETURN 9FT ADLT (ELECTROSURGICAL) ×2
ELECTRODE BLDE 4.0 EZ CLN MEGD (MISCELLANEOUS) ×1 IMPLANT
ELECTRODE REM PT RTRN 9FT ADLT (ELECTROSURGICAL) ×1 IMPLANT
GLOVE BIOGEL M 6.5 STRL (GLOVE) ×12 IMPLANT
GLOVE BIOGEL PI IND STRL 7.0 (GLOVE) ×5 IMPLANT
GLOVE BIOGEL PI IND STRL 8.5 (GLOVE) ×1 IMPLANT
GLOVE BIOGEL PI INDICATOR 7.0 (GLOVE) ×5
GLOVE BIOGEL PI INDICATOR 8.5 (GLOVE) ×1
GLOVE SS BIOGEL STRL SZ 8.5 (GLOVE) ×2 IMPLANT
GLOVE SUPERSENSE BIOGEL SZ 8.5 (GLOVE) ×2
GOWN STRL REUS W/ TWL LRG LVL3 (GOWN DISPOSABLE) ×4 IMPLANT
GOWN STRL REUS W/TWL 2XL LVL3 (GOWN DISPOSABLE) ×2 IMPLANT
GOWN STRL REUS W/TWL LRG LVL3 (GOWN DISPOSABLE) ×4
GUIDEWIRE NITINOL BEVEL TIP (WIRE) ×8 IMPLANT
KIT BASIN OR (CUSTOM PROCEDURE TRAY) ×2 IMPLANT
KIT BONE MRW ASP ANGEL CPRP (KITS) ×2 IMPLANT
KIT POSITION SURG JACKSON T1 (MISCELLANEOUS) ×2 IMPLANT
KIT TURNOVER KIT B (KITS) ×2 IMPLANT
LIGHT SOURCE ANGLE TIP STR 7FT (MISCELLANEOUS) ×2 IMPLANT
MODULE EMG NEEDLE SSEP NVM5 (NEEDLE) ×2 IMPLANT
MODULE NVM5 NEXT GEN EMG (NEEDLE) ×2 IMPLANT
NEEDLE 22X1 1/2 (OR ONLY) (NEEDLE) ×2 IMPLANT
NEEDLE I-PASS III (NEEDLE) ×2 IMPLANT
NEEDLE SPNL 18GX3.5 QUINCKE PK (NEEDLE) ×4 IMPLANT
NS IRRIG 1000ML POUR BTL (IV SOLUTION) ×2 IMPLANT
PACK LAMINECTOMY ORTHO (CUSTOM PROCEDURE TRAY) ×2 IMPLANT
PACK UNIVERSAL I (CUSTOM PROCEDURE TRAY) ×2 IMPLANT
PAD ARMBOARD 7.5X6 YLW CONV (MISCELLANEOUS) ×4 IMPLANT
PATTIES SURGICAL .5 X.5 (GAUZE/BANDAGES/DRESSINGS) ×2 IMPLANT
PATTIES SURGICAL .5 X1 (DISPOSABLE) ×2 IMPLANT
POSITIONER HEAD PRONE TRACH (MISCELLANEOUS) ×2 IMPLANT
PROBE BALL TIP NVM5 SNG USE (BALLOONS) ×2 IMPLANT
PUTTY DBM ALLOSYNC PURE 5CC (Putty) ×6 IMPLANT
REDUCTION EXT RELINE MAS MOD (Neuro Prosthesis/Implant) ×4 IMPLANT
ROD RELINE MAS LORD 5.5X50 (Rod) ×4 IMPLANT
SCREW LOCK RELINE 5.5 TULIP (Screw) ×12 IMPLANT
SCREW RELINE MAS POLY 6.5X40MM (Screw) ×4 IMPLANT
SCREW SHANK MAS MOD 6.5X40MM (Screw) ×2 IMPLANT
SCREW SHANK RELINE 6.5X45MM 2C (Screw) ×2 IMPLANT
SPONGE LAP 4X18 RFD (DISPOSABLE) ×8 IMPLANT
SPONGE SURGIFOAM ABS GEL 100 (HEMOSTASIS) ×2 IMPLANT
SURGIFLO W/THROMBIN 8M KIT (HEMOSTASIS) ×8 IMPLANT
SUT BONE WAX W31G (SUTURE) ×2 IMPLANT
SUT MON AB 3-0 SH 27 (SUTURE) ×2
SUT MON AB 3-0 SH27 (SUTURE) ×2 IMPLANT
SUT SILK 2 0 PERMA HAND 18 BK (SUTURE) ×2 IMPLANT
SUT VIC AB 1 CT1 18XCR BRD 8 (SUTURE) ×1 IMPLANT
SUT VIC AB 1 CT1 8-18 (SUTURE) ×1
SUT VIC AB 2-0 CT1 18 (SUTURE) ×2 IMPLANT
SYR BULB IRRIGATION 50ML (SYRINGE) ×2 IMPLANT
SYR CONTROL 10ML LL (SYRINGE) ×2 IMPLANT
TOWEL GREEN STERILE (TOWEL DISPOSABLE) ×2 IMPLANT
TOWEL GREEN STERILE FF (TOWEL DISPOSABLE) ×2 IMPLANT
TRAY FOLEY MTR SLVR 16FR STAT (SET/KITS/TRAYS/PACK) ×2 IMPLANT
WATER STERILE IRR 1000ML POUR (IV SOLUTION) ×2 IMPLANT
YANKAUER SUCT BULB TIP NO VENT (SUCTIONS) ×2 IMPLANT

## 2019-10-16 NOTE — Transfer of Care (Signed)
Immediate Anesthesia Transfer of Care Note  Patient: Lovelace Cerveny  Procedure(s) Performed: TRANSFORAMINAL LUMBAR INTERBODY FUSION (TLIF) L3-4 (N/A )  Patient Location: PACU  Anesthesia Type:General  Level of Consciousness: drowsy  Airway & Oxygen Therapy: Patient Spontanous Breathing and Patient connected to face mask oxygen  Post-op Assessment: Report given to RN and Post -op Vital signs reviewed and stable  Post vital signs: Reviewed and stable  Last Vitals:  Vitals Value Taken Time  BP 156/86 10/16/19 1345  Temp    Pulse 103 10/16/19 1348  Resp 24 10/16/19 1348  SpO2 100 % 10/16/19 1348  Vitals shown include unvalidated device data.  Last Pain:  Vitals:   10/16/19 0608  PainSc: 4          Complications: No apparent anesthesia complications

## 2019-10-16 NOTE — Discharge Instructions (Signed)
°Spinal Fusion, Adult, Care After °This sheet gives you information about how to care for yourself after your procedure. Your doctor may also give you more specific instructions. If you have problems or questions, contact your doctor. °Follow these instructions at home: °Medicines °· Take over-the-counter and prescription medicines only as told by your doctor. These include any medicines for pain or blood-thinning medicines (anticoagulants). °· If you were prescribed an antibiotic medicine, take it as told by your doctor. Do not stop taking the antibiotic even if you start to feel better. °· Do not drive for 24 hours if you were given a medicine to help you relax (sedative) during your procedure. °· Do not drive or use heavy machinery while taking prescription pain medicine. °If you have a brace: °· Wear the brace as told by your doctor. Take it off only as told by your doctor. °· Keep the brace clean. °Managing pain, stiffness, and swelling °· If directed, put ice on the surgery area: °? If you have a removable brace, take it off as told by your doctor. °? Put ice in a plastic bag. °? Place a towel between your skin and the bag. °? Leave the ice on for 20 minutes, 2-3 times a day. °Surgery cut care ° °  °· Follow instructions from your doctor about how to take care of your cut from surgery (incision). Make sure you: °? Wash your hands with soap and water before you change your bandage (dressing). If you cannot use soap and water, use hand sanitizer. °? Change your bandage as told by your doctor. °? Leave stitches (sutures), skin glue, or skin tape (adhesive) strips in place. They may need to stay in place for 2 weeks or longer. If tape strips get loose and curl up, you may trim the loose edges. Do not remove tape strips completely unless your doctor says it is okay. °· Keep your cut from surgery clean and dry. °? Do not take baths, swim, or use a hot tub until your doctor says it is okay. °? Ask your doctor if you  can take showers. You may only be allowed to take sponge baths. °· Every day, check your cut from surgery and the area around it for: °? More redness, swelling, or pain. °? Fluid or blood. °? Warmth. °? Pus or a bad smell. °· If you have a drain tube, follow instructions from your doctor about caring for it. Do not take out the drain tube or any bandages unless your doctor says it is okay. °Physical activity °· Rest and protect your back as much as possible. °· Follow instructions from your doctor about how to move. Use good posture to help your spine heal. °· Do not lift anything that is heavier than 8 lb (3.6 kg), or the limit that you are told, until your doctor says that it is safe. °· Do not twist or bend at the waist until your doctor says it is okay. °· It is best if you: °? Do not make pushing and pulling motions. °? Do not sit or lie down in the same position for a long time. °? Do not raise your hands or arms above your head. °· Return to your normal activities as told by your doctor. Ask your doctor what activities are safe for you. Rest and protect your back as much as you can. °· Do not start to exercise until your doctor says it is okay. Ask your doctor what kinds of exercise you   can do to make your back stronger. °· Ok to shower in 5 days.  Do not take a bath or submerge the wound °General instructions °· To prevent blood clots and lessen swelling in your legs: °? Wear compression stockings as told. °? Walk one or more times every few hours as told by your doctor. °· Do not use any products that contain nicotine or tobacco, such as cigarettes and e-cigarettes. These can delay bone healing. If you need help quitting, ask your doctor. °· To prevent or treat constipation while you are taking prescription pain medicine, your doctor may suggest that you: °? Drink enough fluid to keep your pee (urine) pale yellow. °? Take over-the-counter or prescription medicines. °? Eat foods that are high in fiber. These  include fresh fruits and vegetables, whole grains, and beans. °? Limit foods that are high in fat and processed sugars, such as fried and sweet foods. °· Keep all follow-up visits as told by your doctor. This is important. °Contact a doctor if: °· Your pain gets worse. °· Your medicine does not help your pain. °· Your legs or feet get painful or swollen. °· Your cut from surgery is more red, swollen, or painful. °· Your cut from surgery feels warm to the touch. °· You have: °? Fluid or blood coming from your cut from surgery. °? Pus or a bad smell coming from your cut from surgery. °? A fever. °? Weakness or loss of feeling (numbness) in your legs that is new or getting worse. °? Trouble controlling when you pee (urinate) or poop (have a bowel movement). °· You feel sick to your stomach (nauseous). °· You throw up (vomit). °Get help right away if: °· Your pain is very bad. °· You have chest pain. °· You have trouble breathing. °· You start to have a cough. °These symptoms may be an emergency. Do not wait to see if the symptoms will go away. Get medical help right away. Call your local emergency services (911 in the [place] Do not drive yourself to the hospital. °Summary °· After the procedure, it is common to have pain in your back and pain by your surgery cut(s). °· Icing and pain medicines may help to control the pain. Follow directions from your doctor. °· Rest and protect your back as much as possible. Do not twist or bend at the waist. °· Get up and walk one or more times every few hours as told by your doctor. °This information is not intended to replace advice given to you by your health care provider. Make sure you discuss any questions you have with your health care provider. ° °Enoxaparin injection °What is this medicine? °ENOXAPARIN (ee nox a PA rin) is used after knee, hip, or abdominal surgeries to prevent blood clotting. It is also used to treat existing blood clots in the lungs or in the veins. °This  medicine may be used for other purposes; ask your health care provider or pharmacist if you have questions. °COMMON BRAND NAME(S): Lovenox °What should I tell my health care provider before I take this medicine? °They need to know if you have any of these conditions: °-bleeding disorders, hemorrhage, or hemophilia °-infection of the heart or heart valves °-kidney or liver disease °-previous stroke °-prosthetic heart valve °-recent surgery or delivery of a baby °-ulcer in the stomach or intestine, diverticulitis, or other bowel disease °-an unusual or allergic reaction to enoxaparin, heparin, pork or pork products, other medicines, foods, dyes, or preservatives °-  pregnant or trying to get pregnant °-breast-feeding °How should I use this medicine? °This medicine is for injection under the skin. It is usually given by a health-care professional. You or a family member may be trained on how to give the injections. If you are to give yourself injections, make sure you understand how to use the syringe, measure the dose if necessary, and give the injection. To avoid bruising, do not rub the site where this medicine has been injected. Do not take your medicine more often than directed. Do not stop taking except on the advice of your doctor or health care professional. °Make sure you receive a puncture-resistant container to dispose of the needles and syringes once you have finished with them. Do not reuse these items. Return the container to your doctor or health care professional for proper disposal. °Talk to your pediatrician regarding the use of this medicine in children. Special care may be needed. °Overdosage: If you think you have taken too much of this medicine contact a poison control center or emergency room at once. °NOTE: This medicine is only for you. Do not share this medicine with others. °What if I miss a dose? °If you miss a dose, take it as soon as you can. If it is almost time for your next dose, take  only that dose. Do not take double or extra doses. °What may interact with this medicine? °-aspirin and aspirin-like medicines °-certain medicines that treat or prevent blood clots °-dipyridamole °-NSAIDs, medicines for pain and inflammation, like ibuprofen or naproxen °This list may not describe all possible interactions. Give your health care provider a list of all the medicines, herbs, non-prescription drugs, or dietary supplements you use. Also tell them if you smoke, drink alcohol, or use illegal drugs. Some items may interact with your medicine. °What should I watch for while using this medicine? °Visit your healthcare professional for regular checks on your progress. You may need blood work done while you are taking this medicine. Your condition will be monitored carefully while you are receiving this medicine. It is important not to miss any appointments. °If you are going to need surgery or other procedure, tell your healthcare professional that you are using this medicine. °Using this medicine for a long time may weaken your bones and increase the risk of bone fractures. °Avoid sports and activities that might cause injury while you are using this medicine. Severe falls or injuries can cause unseen bleeding. Be careful when using sharp tools or knives. Consider using an electric razor. Take special care brushing or flossing your teeth. Report any injuries, bruising, or red spots on the skin to your healthcare professional. °Wear a medical ID bracelet or chain. Carry a card that describes your disease and details of your medicine and dosage times. °What side effects may I notice from receiving this medicine? °Side effects that you should report to your doctor or health care professional as soon as possible: °-allergic reactions like skin rash, itching or hives, swelling of the face, lips, or tongue °-bone pain °-signs and symptoms of bleeding such as bloody or black, tarry stools; red or dark-brown urine;  spitting up blood or brown material that looks like coffee grounds; red spots on the skin; unusual bruising or bleeding from the eye, gums, or nose °-signs and symptoms of a blood clot such as chest pain; shortness of breath; pain, swelling, or warmth in the leg °-signs and symptoms of a stroke such as changes in vision; confusion; trouble   speaking or understanding; severe headaches; sudden numbness or weakness of the face, arm or leg; trouble walking; dizziness; loss of coordination °Side effects that usually do not require medical attention (report to your doctor or health care professional if they continue or are bothersome): °-hair loss °-pain, redness, or irritation at site where injected °This list may not describe all possible side effects. Call your doctor for medical advice about side effects. You may report side effects to FDA at 1-800-FDA-1088. °Where should I keep my medicine? °Keep out of the reach of children. °Store at room temperature between 15 and 30 degrees C (59 and 86 degrees F). Do not freeze. If your injections have been specially prepared, you may need to store them in the refrigerator. Ask your pharmacist. Throw away any unused medicine after the expiration date. °NOTE: This sheet is a summary. It may not cover all possible information. If you have questions about this medicine, talk to your doctor, pharmacist, or health care provider. °

## 2019-10-16 NOTE — Brief Op Note (Signed)
10/16/2019  1:08 PM  PATIENT:  Ryan Leblanc  58 y.o. 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  POST-OPERATIVE DIAGNOSIS:  Post laminectomy syndrome with degenerative slip and recurrent   PROCEDURE:  Procedure(s) with comments: TRANSFORAMINAL LUMBAR INTERBODY FUSION (TLIF) L3-4 (N/A) - 4 hrs  SURGEON:  Surgeon(s) and Role:    Melina Schools, MD - Primary  PHYSICIAN ASSISTANT:   ASSISTANTS: Amanda Ward, PA   ANESTHESIA:   general  EBL:  1350 mL   BLOOD ADMINISTERED:none  DRAINS: (1) TLS Drain(s) to suction in the back   LOCAL MEDICATIONS USED:  MARCAINE     SPECIMEN:  No Specimen  DISPOSITION OF SPECIMEN:  N/A  COUNTS:  YES  TOURNIQUET:  * No tourniquets in log *  DICTATION: .Dragon Dictation  PLAN OF CARE: Admit to inpatient   PATIENT DISPOSITION:  PACU - hemodynamically stable.

## 2019-10-16 NOTE — Op Note (Signed)
Operative report  Preoperative diagnosis: Right L3-4 disc herniation with cephalad migration producing right radicular leg pain.  Status post left lumbar decompression discectomy.  Postoperative diagnosis: Same  Operative procedure: Transforaminal lumbar interbody fusion (TLIF) L3-4.    1. Complete Gill decompression on the right side with laminectomy of L3 and complete   facetectomy of the L3-4 facet.    2. Excision of large posterior lateral to the right disc herniation L3-4   3.  Posterior lateral fusion with pedicle screw fixation L3-4.  4.  Intervertebral spacer packed with autograft bone as well as allograft.  First assistant: Cleta Alberts, PA  Complications: None  EBL: 1300 cc  Implants:  1. NuVasive MIS pedicle screw fixation.  Left: 6.5 x 40 mm length screws.  Right: L3: 6.5 x 40 mm  length.  L4: 6.5 x 45 mm length.  50 mm rod used bilaterally. 2. Globus sable intervertebral expandable cage.  Allograft: Allosync with BMG.  Also utilized autograft from lumbar decompression.  Neuro monitoring: All pedicle screws were directly stimulated and there is no adverse activity at greater than 40 mA.  No adverse activity throughout the case as a relates to free running EMGs, and SSEPs.  Indications: Ryan Leblanc is a very pleasant 58 year old gentleman who presents with significant back buttock and radicular right leg pain.  Patient had a previous left L3-4 discectomy and has done well until recently.  Imaging shows a large posterior lateral disc herniation with cephalad migration.  As result of the severity of his pain and the inability to improve with conservative measures we elected to move forward with surgery.  All appropriate risks benefits and alternatives were discussed with the patient and consent was obtained.  Operative report:  Patient was brought the operating room placed upon the operating table.  After successful induction of general anesthesia endotracheal ovation teds SCDs and a  Foley were inserted.  The neuro monitoring representative then applied all appropriate needles and pads for intraoperative SSEP and EMG monitoring.  Patient was turned prone onto the Wilson frame and all bony prominences were well-padded.  The back was then prepped and draped in a standard fashion.  Timeout was taken to confirm patient procedure and all other important data.  Fluoroscopy was then used to identify the lateral border of the L3 and L4 pedicle.  These were marked on the skin surface on the left side.  I infiltrated the area with half percent Marcaine with epinephrine.  Small incision was made and the Jamshidi needle was advanced percutaneously down to the level of the lateral aspect of the facet complex.  I confirmed satisfactory position in the AP plane and then began to advance the Jamshidi needle into the L4 pedicle.  While using fluoroscopy to guide my trajectory I also stimulated the Jamshidi needle confirming that there was no abnormal nerve irritation.  As I near the medial wall of the pedicle I switched the fluoroscopy view to the lateral image.  I confirmed that I was just beyond the posterior wall the vertebral body confirming satisfactory trajectory and position.  Advanced into the vertebral body.  I then aspirated the bone marrow in order to facilitate the allosync bone graft preparation.  The guidepin was advanced through the Jamshidi needle to cannulate the L4 pedicle.  I then repeated this procedure at L3.  Again using live fluoroscopy as well as the free running EMG stimulation to guide the Jamshidi needle down and into the L3 vertebral body.  I this pedicle  was then cannulated and then I went to the contralateral side.  I marked out the lateral aspect of the L3 and L4 pedicle and then filtrated this area with half percent Marcaine with epinephrine.  The Wiltsie incision was made and I sharply dissected down to the deep fascia the deep fascia was sharply incised and I bluntly dissected  through the paraspinal muscle with my finger until I could palpate the L3-4 facet complex.  Once I was able to palpate this I placed my Jamshidi needle down and using the same technique I used at the contralateral side I advanced the Jamshidi needle into the L4 pedicle.  Once I was properly positioned in both the AP and lateral planes and I confirmed there is no adverse free running EMG activity I placed a guidepin through the Jamshidi needle to cannulate the pedicle.  I then repeated this at L3.  At this point I then measured and then placed the appropriate size pedicle screw retractor complex down over the guidepins.  Once both pedicle screws were then I built my retracting system and I could clearly visualize the posterior lateral aspect of the spine.  I then directly stimulated each of the pedicle screws and there was no adverse activity at 40 mA.  I then gently began to mobilize the remaining paraspinal muscles to expose the lamina and spinous process of L3 and then placed my medial retracting blade.  Using a Bovie I removed the facet capsule of the L3-4 facet joint, and used a curette to visualize the L3 pars.  Bipolar cautery was used to obtain hemostasis.  Using a osteotome I resected the inferior L3 facet in its entirety.  I then used a straight osteotome to resect the superior portion of the L4 facet.  I then used my 3 mm Kerrison rongeur to perform a near complete laminectomy of L3.  I then dissected through the ligamentum flavum with my Penfield 4 and then used my 3 mm Kerrison rongeur to resect the ligamentum flavum.  Once I had the ligamentum flavum removed I could now visualize the thecal sac as well as the L4 nerve root.  I then resected the remaining medial portion of the L4 facet until I could visualize the medial aspect of the L4 pedicle.  I then continued resecting the pars of L3 until I can visualize the L3 nerve root.  I then gently mobilized the thecal sac and expose the very large epidural  veins.  Using bipolar cautery I obtained hemostasis and resected the large epidural veins.  I then continue to mobilize the thecal sac medially until I could expose the posterior lateral aspect of the disc.  An annulotomy was performed with a 15 blade scalpel and then I used pituitary rongeurs curettes and Kerrison rongeurs to remove the bulk of the disc in the L3-4 space.  Once this was complete I was able to work more superiorly resecting the disc fragment.  3 large fragments of disc material were removed which was consistent with the preoperative MRI.  Once these 3 fragments were removed I could mobilize the L3 nerve root with much greater ease.  I tracked the L3 nerve root superiorly well into the axilla of the root.  I could clearly visualize and palpate the medial border of the pedicle of L3.  With the L3 nerve root protected I coagulated the very large epidural veins and then began sweeping under the annulus to ensure there were no further fragments of disc material.  Based on the preoperative MRI the disc herniation was cephalad between the L3 pedicle and the L3-4 disc space.  I confirmed that I had this complete area decompressed.  I then proceeded inferiorly performing a foraminotomy of L4.  At this point the L4 nerve root was easily mobile and I can easily pass my [facility] underneath the nerve root and out the foramen.  I could also palpate the inferior and medial and superior aspect of the pedicle to confirm satisfactory decompression and placement of the pedicle screw.  At this point with the decompression and discectomy complete I irrigated the wound copiously with normal saline.  I then placed autograft bone along the anterior annulus and then placed the expandable 6 to 12 mm globus cage.  This was advanced on an angle to ensure that I was at least at or just beyond the midline.  I then expanded it approximately 11 mm.  I then backfilled it with bone graft.  I remove the inserting device  and confirmed that the posterior aspect of the cage was just below the vertebral body of L3 and L4.  There was no compression or irritation to the thecal sac or the L3 or L4 nerve root.   I then reduce the kyphosis that had built into the Wilson frame started the patient in order to fill silicate rod placement.  I applied the polyaxial heads to the screws and remove the retracting device.  I then measured and placed the appropriate size rods.  They were secured in with the locking nuts.  I then went to the left side and over the guidepins placed the 6.5 x 40 mm screws.  Once the screws were in place I then directly stimulated and again there was no adverse activity at greater than 40 mA.  I then measured and placed the same size rod and locked it in place with the locking nuts..  Final x-rays were taken in both the AP and lateral planes confirming satisfactory positioning of the hardware and decompression.  I then took my nerve hook and palpated underneath the thecal sac at the level of the disc space and posterior to the L3 vertebral body to confirm satisfactory decompression and excision of the large disc fragment.  I ensure that the L3 and L4 nerve roots were freely mobile.  In addition I directly visualize the posterior aspect of the cage confirming it was within the intervertebral space.  The L3 nerve root was also directly visible and was noted to be adequately decompressed as was the L4.  Hemostasis was then obtained using bipolar electrocautery and FloSeal.  After final irrigation I did place a deep drain.  Because of the large blood loss secondary to his calcified epidural veins I elected to place a drain to prevent postoperative hematoma formation.  The drain was taken out of a separate stab incision site.  The deep fascia was closed in with #1 Vicryl suture in a 2 layer fashion and then superficial with 2-0 Vicryl suture and the skin with 3-0 Monocryl.  On the left side the wounds were closed in a  layered fashion with interrupted #1 Vicryl suture, 2-0 Vicryl suture, and 3-0 Monocryl.  Steri-Strips and dry dressings were applied and the patient was ultimately extubated transfer the PACU without incident.  The end of the case all needle sponge counts were correct.  There were no adverse intraoperative events.  Final EMG/SSEP monitoring was noted to be normal with no adverse activity.

## 2019-10-16 NOTE — Anesthesia Postprocedure Evaluation (Signed)
Anesthesia Post Note  Patient: Ryan Leblanc  Procedure(s) Performed: TRANSFORAMINAL LUMBAR INTERBODY FUSION (TLIF) L3-4 (N/A )     Patient location during evaluation: PACU Anesthesia Type: General Level of consciousness: awake and alert Pain management: pain level controlled Vital Signs Assessment: post-procedure vital signs reviewed and stable Respiratory status: spontaneous breathing, nonlabored ventilation, respiratory function stable and patient connected to nasal cannula oxygen Cardiovascular status: blood pressure returned to baseline and stable Postop Assessment: no apparent nausea or vomiting Anesthetic complications: no    Last Vitals:  Vitals:   10/16/19 1415 10/16/19 1430  BP: (!) 160/71 (!) 143/74  Pulse: (!) 106 (!) 103  Resp: 18 17  Temp:    SpO2: 98% 96%    Last Pain:  Vitals:   10/16/19 1430  PainSc: [place] A Jameshia Hayashida

## 2019-10-16 NOTE — H&P (Signed)
Addendum H&P  Patient continues to have severe back buttock and radicular right leg pain.  Patient has a previous left L3-4 discectomy and now has a recurrent right L3-4 disc herniation with degenerative disc disease producing both back and radicular right leg pain.  Attempts at conservative management had failed to alleviate his symptoms and improve his quality of life.  As a result we have elected to move forward with the transforaminal lumbar interbody fusion L3-4.  I have gone over the surgery in great detail with the patient including the risks and benefits.  All of his questions were encouraged and addressed.  There is been no change in his clinical exam since his last office visit of 10/07/2019.

## 2019-10-16 NOTE — Anesthesia Procedure Notes (Cosign Needed)
Procedure Name: Intubation Date/Time: 10/16/2019 7:44 AM Performed by: Barnet Glasgow, MD Pre-anesthesia Checklist: Patient identified, Emergency Drugs available, Suction available, Patient being monitored and Timeout performed Patient Re-evaluated:Patient Re-evaluated prior to induction Oxygen Delivery Method: Circle system utilized Preoxygenation: Pre-oxygenation with 100% oxygen Induction Type: IV induction Laryngoscope Size: Mac and 3 Grade View: Grade II Tube type: Oral Tube size: 7.5 mm Number of attempts: 1 Airway Equipment and Method: Stylet Placement Confirmation: ETT inserted through vocal cords under direct vision,  positive ETCO2 and breath sounds checked- equal and bilateral Secured at: 22 cm Tube secured with: Tape Dental Injury: Teeth and Oropharynx as per pre-operative assessment

## 2019-10-17 LAB — GLUCOSE, CAPILLARY: Glucose-Capillary: 162 mg/dL — ABNORMAL HIGH (ref 70–99)

## 2019-10-17 LAB — CBC
HCT: 32.9 % — ABNORMAL LOW (ref 39.0–52.0)
Hemoglobin: 10.8 g/dL — ABNORMAL LOW (ref 13.0–17.0)
MCH: 28.6 pg (ref 26.0–34.0)
MCHC: 32.8 g/dL (ref 30.0–36.0)
MCV: 87 fL (ref 80.0–100.0)
Platelets: DECREASED 10*3/uL (ref 150–400)
RBC: 3.78 MIL/uL — ABNORMAL LOW (ref 4.22–5.81)
RDW: 13.5 % (ref 11.5–15.5)
WBC: 14.3 10*3/uL — ABNORMAL HIGH (ref 4.0–10.5)
nRBC: 0 % (ref 0.0–0.2)

## 2019-10-17 NOTE — Discharge Summary (Signed)
Orthopedic Discharge Summary        Physician Discharge Summary  Patient ID: Ryan Leblanc MRN: [id number] DOB/AGE: 1961/02/09 59 y.o.  Admit date: 10/16/2019 Discharge date: 10/17/2019   Procedures:  Procedure(s) (LRB): TRANSFORAMINAL LUMBAR INTERBODY FUSION (TLIF) L3-4 (N/A)  Attending Physician:  Dr. Venita Lick  Admission Diagnoses:   Lumbar stenosis  Discharge Diagnoses:  same   Past Medical History:  Diagnosis Date  . Arthritis    right hand  . Depression   . Diabetes mellitus without complication (HCC)   . History of kidney stones     PCP: Harlow Mares, MD   Discharged Condition: good  Hospital Course:  Patient underwent the above stated procedure on 10/16/2019. Patient tolerated the procedure well and brought to the recovery room in good condition and subsequently to the floor. Patient had an uncomplicated hospital course and was stable for discharge.   Disposition: Discharge disposition: 01-Home or Self Care      with follow up in 2 weeks   Follow-up Information    Venita Lick, MD. Schedule an appointment as soon as possible for a visit in 2 weeks.   Specialty: Orthopedic Surgery Why: If symptoms worsen, For suture removal, For wound re-check Contact information: [address] [place] [address] [phone]           Discharge Instructions    Call MD / Call 911   Complete by: As directed    If you experience chest pain or shortness of breath, CALL 911 and be transported to the hospital emergency room.  If you develope a fever above 101 F, pus (white drainage) or increased drainage or redness at the wound, or calf pain, call your surgeon's office.   Constipation Prevention   Complete by: As directed    Drink plenty of fluids.  Prune juice may be helpful.  You may use a stool softener, such as Colace (over the counter) 100 mg twice a day.  Use MiraLax (over the counter) for constipation as needed.   Diet - low  sodium heart healthy   Complete by: As directed    Incentive spirometry RT   Complete by: As directed    Increase activity slowly as tolerated   Complete by: As directed       Allergies as of 10/17/2019   No Known Allergies     Medication List    STOP taking these medications   naproxen 500 MG tablet Commonly known as: NAPROSYN   OVER THE COUNTER MEDICATION   tiZANidine 4 MG tablet Commonly known as: ZANAFLEX     TAKE these medications   gabapentin 300 MG capsule Commonly known as: NEURONTIN Take 300-600 mg by mouth See admin instructions. Take 1 capsule (300 mg) by mouth in the morning, take 1 capsule (300 mg) by mouth in the afternoon, & take 2 capsules (600 mg) by mouth at night.   Invokamet XR [phone] MG Tb24 Generic drug: Canagliflozin-metFORMIN HCl ER Take 2 tablets by mouth daily.   methocarbamol 500 MG tablet Commonly known as: Robaxin Take 1 tablet (500 mg total) by mouth every 8 (eight) hours as needed for up to 5 days for muscle spasms.   ondansetron 4 MG tablet Commonly known as: Zofran Take 1 tablet (4 mg total) by mouth every 8 (eight) hours as needed for nausea or vomiting.   oxyCODONE-acetaminophen 10-325 MG tablet Commonly known as: Percocet Take 1 tablet by mouth every 6 (six) hours as needed for up to  5 days for pain.   Trulicity 2.64 BR/8.3EN Sopn Generic drug: Dulaglutide Inject 0.75 mg into the skin every Monday.         Signed: Augustin Schooling 10/17/2019, 9:46 AM  [facility] is now [facility] [address], [place], [place], [place] [address] Phone: [place]

## 2019-10-17 NOTE — Evaluation (Signed)
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 PMH includes left L3-4 discectomy and DM2.  Clinical Impression   Pt presents with mild back pain, initially decreased knowledge of precautions but pt with good understanding and application during session, increased time and effort to mobilize, and decreased activity tolerance post-operatively. Pt to benefit from acute PT to address deficits. Pt ambulated hallway distance initially with RW transitioning to straight cane, pt with good technique with cane use and no twisting or spinal flexion present with cane use, PT reinforced cane technique post-operatively. Pt demonstrated proficiency with stair navigation, pt and wife with no further questions. Pt ready to d/c home from PT standpoint.  acutely.      Follow Up Recommendations Follow surgeon's recommendation for DC plan and follow-up therapies;Supervision for mobility/OOB    Equipment Recommendations  None recommended by PT    Recommendations for Other Services       Precautions / Restrictions Precautions Precautions: Fall;Back Precaution Booklet Issued: Yes (comment) Precaution Comments: booklet issued by OT, pt able to state "no bending, lifting, twisting" back precautions. PT reviewed at start of session Required Braces or Orthoses: Spinal Brace Spinal Brace: Lumbar corset Restrictions Weight Bearing Restrictions: No      Mobility  Bed Mobility Overal bed mobility: Needs Assistance Bed Mobility: Sidelying to Sit;Sit to Sidelying;Rolling Rolling: Supervision Sidelying to sit: Supervision     Sit to sidelying: Supervision General bed mobility comments: supervision for safety, verbal reinforcement of log roll technique and use of no railing as pt does  not have rails at home. Pt's wife shown how to assist LEs if needed for sit to sidelying.  Transfers Overall transfer level: Needs assistance Equipment used: Rolling walker (2 wheeled) Transfers: Sit to/from Stand Sit to Stand: Supervision         General transfer comment: supervision for safety, verbal cuing to push up from bed and not pull up on RW.  Ambulation/Gait Ambulation/Gait assistance: Supervision;Min guard Gait Distance (Feet): 150 Feet Assistive device: Rolling walker (2 wheeled);Straight cane Gait Pattern/deviations: Step-to pattern;Step-through pattern;Decreased stride length;Trunk flexed Gait velocity: decr   General Gait Details: min guard initially for safety, transitioning to supervision. Verbal cuing for upright posture, placement in RW. PT transitioned pt from RW to straight cane, pt demonstrating appropriate ambulation with cane and PT reinforced ensuring no twisting spine or leaning toward cane.  Stairs Stairs: Yes Stairs assistance: Min guard Stair Management: One rail Right;Step to pattern;Forwards Number of Stairs: 2 General stair comments: min guard for safety, verbal cuing for bending from hips and knees as opposed to back especially with descending steps.  Wheelchair Mobility    Modified Rankin (Stroke Patients Only)       Balance Overall balance assessment: Mild deficits observed, not formally tested                                           Pertinent Vitals/Pain Pain Assessment: 0-10 Pain Score: 3  Faces Pain Scale: Hurts even more Pain Location: operative site Pain Descriptors / Indicators: Aching;Sore;Operative site guarding Pain Intervention(s): Limited activity within patient's tolerance;Monitored during session;Premedicated before session;Repositioned    Home Living Family/patient expects to be discharged to::  Private residence Living Arrangements: Spouse/significant other Available Help at Discharge: Available  24 hours/day(wife on disability) Type of Home: House Home Access: Stairs to enter   [employer] of Steps: 1 Home Layout: One level Home Equipment: [place] - single point;Shower seat;Walker - 4 wheels Additional Comments: wife will be home 24/7, states he plans to use RW while healing and shower seat    Prior Function Level of Independence: Independent with assistive device(s)         Comments: has been using cane with more recent severe pain; prior to work injury was independent     Hand Dominance   Dominant Hand: Right    Extremity/Trunk Assessment   Upper Extremity Assessment Upper Extremity Assessment: Defer to OT evaluation    Lower Extremity Assessment Lower Extremity Assessment: Overall WFL for tasks assessed    Cervical / Trunk Assessment Cervical / Trunk Assessment: Normal  Communication   Communication: No difficulties  Cognition Arousal/Alertness: Awake/alert Behavior During Therapy: WFL for tasks assessed/performed Overall Cognitive Status: Within Functional Limits for tasks assessed                                        General Comments      Exercises     Assessment/Plan    PT Assessment Patient needs continued PT services  PT Problem List Decreased strength;Decreased mobility;Decreased activity tolerance;Decreased balance;Decreased knowledge of use of DME;Decreased knowledge of precautions;Pain       PT Treatment Interventions DME instruction;Therapeutic activities;Gait training;Therapeutic exercise;Balance training;Stair training;Patient/family education;Functional mobility training    PT Goals (Current goals can be found in the Care Plan section)  Acute Rehab PT Goals Patient Stated Goal: return home to wife PT Goal Formulation: With patient/family Time For Goal Achievement: 10/24/19 Potential to Achieve Goals: Good    Frequency Min 5X/week   Barriers to discharge        Co-evaluation                AM-PAC PT "6 Clicks" Mobility  Outcome Measure Help needed turning from your back to your side while in a flat bed without using bedrails?: None Help needed moving from lying on your back to sitting on the side of a flat bed without using bedrails?: A Little Help needed moving to and from a bed to a chair (including a wheelchair)?: A Little Help needed standing up from a chair using your arms (e.g., wheelchair or bedside chair)?: A Little Help needed to walk in hospital room?: A Little Help needed climbing 3-5 steps with a railing? : A Little 6 Click Score: 19    End of Session Equipment Utilized During Treatment: Back brace Activity Tolerance: Patient tolerated treatment well Patient left: in bed;with call bell/phone within reach;with family/visitor present Nurse Communication: Mobility status PT Visit Diagnosis: Other abnormalities of gait and mobility (R26.89)    Time: 0932-3557 PT Time Calculation (min) (ACUTE ONLY): 19 min   Charges:   PT Evaluation $PT Eval Low Complexity: 1 Low          Rashea Hoskie E, PT Acute Rehabilitation Services Pager [phone]  Office [phone]   Taniaya Rudder D Adilson Grafton 10/17/2019, 11:52 AM

## 2019-10-17 NOTE — Progress Notes (Signed)
Patient is discharged from room 3C09 at this time. Alert and in stable condition. IV site d/c'd and instructions read to patient and spouse with understanding verbalized. Left unit via wheelchair with all belongings at side. 

## 2019-10-17 NOTE — Evaluation (Signed)
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 PMH includes left L3-4 discectomy and DM2.   Clinical Impression   PTA pt mod I with use of cane, living with wife. At time of eval, he is mod I with increased time and effort to complete bed mobility and supervision for transfers. He was able to complete toilet transfer without use of AD and states he has a cut out wall he can push up from at home if needed. Reviewed precaution sheet with BADL examples and practice. Pt states he prefers wife to assist where he needs help, which is mostly with LB dressing. Pt in understanding of precautions and able to don brace at mod I. He has a shower seat and accessible home. No further OT needs noted at this time. Thank you for this consult.     Follow Up Recommendations  No OT follow up;Supervision - Intermittent    Equipment Recommendations  None recommended by OT    Recommendations for Other Services       Precautions / Restrictions Precautions Precautions: Fall;Back Precaution Booklet Issued: Yes (comment) Precaution Comments: booklet issued, reviewed and practiced with BADL Required Braces or Orthoses: Spinal Brace Spinal Brace: Lumbar corset Restrictions Weight Bearing Restrictions: No      Mobility Bed Mobility Overal bed mobility: Needs Assistance Bed Mobility: Sidelying to Sit;Sit to Sidelying   Sidelying to sit: Modified independent (Device/Increase time)     Sit to sidelying: Modified independent (Device/Increase time) General bed mobility comments: increased time and effort, use of bed rail to push  Transfers Overall transfer level: Needs assistance Equipment used: Straight cane Transfers: Sit to/from Stand Sit to Stand: Supervision          General transfer comment: supervision for safety, one posterior LOB that was self corrected by patient    Balance Overall balance assessment: Mild deficits observed, not formally tested                                         ADL either performed or assessed with clinical judgement   ADL Overall ADL's : Needs assistance/impaired Eating/Feeding: Modified independent   Grooming: Modified independent;Sitting;Standing   Upper Body Bathing: Modified independent;Sitting   Lower Body Bathing: Set up;Sit to/from stand Lower Body Bathing Details (indicate cue type and reason): sitting on shower chair Upper Body Dressing : Modified independent;Sitting Upper Body Dressing Details (indicate cue type and reason): able to don brace sitting EOB without phys assist or cues Lower Body Dressing: Min guard;Minimal assistance;Sit to/from stand Lower Body Dressing Details (indicate cue type and reason): to reach feet, prefers wife to assist Toilet Transfer: Supervision/safety;Regular Teacher, adult education Details (indicate cue type and reason): completed toilet transfer in bathroom with use of SPC     Tub/ Shower Transfer: Supervision/safety;Adhering to back precautions Tub/Shower Transfer Details (indicate cue type and reason): reviewed strategies to use back precautions, also states wife will assist as needed Functional mobility during ADLs: Supervision/safety;Cane General ADL Comments: pt mostly limited by post sx pain and precautions     Vision Baseline Vision/History: Wears glasses Wears Glasses: At all times Patient Visual Report: No change from baseline       Perception  Praxis      Pertinent Vitals/Pain Pain Assessment: Faces Faces Pain Scale: Hurts even more Pain Location: operative site Pain Descriptors / Indicators: Aching;Sore;Operative site guarding Pain Intervention(s): Limited activity within patient's tolerance;Monitored during session;Repositioned      Hand Dominance     Extremity/Trunk Assessment Upper Extremity Assessment Upper Extremity Assessment: Overall WFL for tasks assessed   Lower Extremity Assessment Lower Extremity Assessment: Defer to PT evaluation       Communication Communication Communication: No difficulties   Cognition Arousal/Alertness: Awake/alert Behavior During Therapy: WFL for tasks assessed/performed Overall Cognitive Status: Within Functional Limits for tasks assessed                                     General Comments       Exercises     Shoulder Instructions      Home Living Family/patient expects to be discharged to:: Private residence Living Arrangements: Spouse/significant other Available Help at Discharge: Available 24 hours/day(wife on disability) Type of Home: House Home Access: Stairs to enter [employer] of Steps: 1   Home Layout: One level     Bathroom Shower/Tub: Occupational psychologist: Standard(has cut out in wall next to toilet he can push up from) Bathroom Accessibility: Yes How Accessible: Accessible via walker Home Equipment: [place] - 2 wheels;Cane - single point;Shower seat   Additional Comments: wife will be home 24/7, states he plans to use RW while healing and shower seat      Prior Functioning/Environment Level of Independence: Independent with assistive device(s)        Comments: has been using cane with more recent severe pain; prior to work injury was independent        OT Problem List: Decreased knowledge of use of DME or AE;Decreased knowledge of precautions;Decreased activity tolerance;Pain;Impaired balance (sitting and/or standing)      OT Treatment/Interventions:      OT Goals(Current goals can be found in the care plan section) Acute Rehab OT Goals Patient Stated Goal: return home to wife OT Goal Formulation: With patient Time For Goal Achievement: 10/31/19 Potential to Achieve Goals: Good  OT  Frequency:     Barriers to D/C:            Co-evaluation              AM-PAC OT "6 Clicks" Daily Activity     Outcome Measure Help from another person eating meals?: None Help from another person taking care of personal grooming?: None Help from another person toileting, which includes using toliet, bedpan, or urinal?: None Help from another person bathing (including washing, rinsing, drying)?: A Little Help from another person to put on and taking off regular upper body clothing?: None Help from another person to put on and taking off regular lower body clothing?: A Little 6 Click Score: 22   End of Session Equipment Utilized During Treatment: Back brace;Other (comment)(SPC) Nurse Communication: Mobility status;Precautions  Activity Tolerance: Patient tolerated treatment well Patient left: in bed;with call bell/phone within reach  OT Visit Diagnosis: Unsteadiness on feet (R26.81);Other abnormalities of gait and mobility (R26.89);Pain Pain - part of body: (back)                Time: 8563-1497 OT Time Calculation (min): 16 min Charges:  OT General Charges $OT Visit: 1 Visit OT Evaluation $OT Eval Low Complexity: 1 Low OT Treatments $Self Care/Home Management :  8-22 mins  Dalphine Handing, MSOT, OTR/L Behavioral Health OT/ Acute Relief OT [facility] Office: [phone]  Dalphine Handing 10/17/2019, 10:31 AM

## 2019-10-17 NOTE — Progress Notes (Signed)
Orthopedics Progress Note  Subjective: Patient feeling better, no complaints  Objective:  Vitals:   10/17/19 0535 10/17/19 0733  BP:  130/66  Pulse: (!) 104 (!) 103  Resp:  16  Temp: 100.3 F (37.9 C) 99.2 F (37.3 C)  SpO2:  95%    General: Awake and alert  Musculoskeletal: drain pulled, dressing intact Neurovascularly intact distally, 5/5 motor, sensation intact  Lab Results  Component Value Date   WBC 14.3 (H) 10/17/2019   HGB 10.8 (L) 10/17/2019   HCT 32.9 (L) 10/17/2019   MCV 87.0 10/17/2019   PLT  10/17/2019    PLATELET CLUMPS NOTED ON SMEAR, COUNT APPEARS DECREASED       Component Value Date/Time   NA 137 10/16/2019 1233   K 5.3 (H) 10/16/2019 1233   CL 103 10/13/2019 1100   CO2 22 10/13/2019 1100   GLUCOSE 270 (H) 10/13/2019 1100   BUN 13 10/13/2019 1100   CREATININE 1.14 10/13/2019 1100   CALCIUM 9.2 10/13/2019 1100   GFRNONAA >60 10/13/2019 1100   GFRAA >60 10/13/2019 1100    No results found for: INR, PROTIME  Assessment/Plan: POD #2 s/p Procedure(s): TRANSFORAMINAL LUMBAR INTERBODY FUSION (TLIF) L3-4 Doing well following lumbar decompression Discharge today after therapy Follow up with Shon Baton in the office  Viviann Spare R. Ranell Patrick, MD 10/17/2019 9:44 AM

## 2019-10-20 MED FILL — Thrombin (Recombinant) For Soln 20000 Unit: CUTANEOUS | Qty: 1 | Status: AC

## 2019-10-21 ENCOUNTER — Encounter: Payer: Self-pay | Admitting: *Deleted

## 2020-07-31 IMAGING — RF DG LUMBAR SPINE 2-3V
1 series · 4 of 4 positions shown · non-contrast
Comparison: None.

CLINICAL DATA: L3-4 TLIF

EXAM:
DG C-ARM 1-60 MIN; LUMBAR SPINE - 2-3 VIEW

[Series 1: run · 4 of 4 slices shown]
[im 1/4]
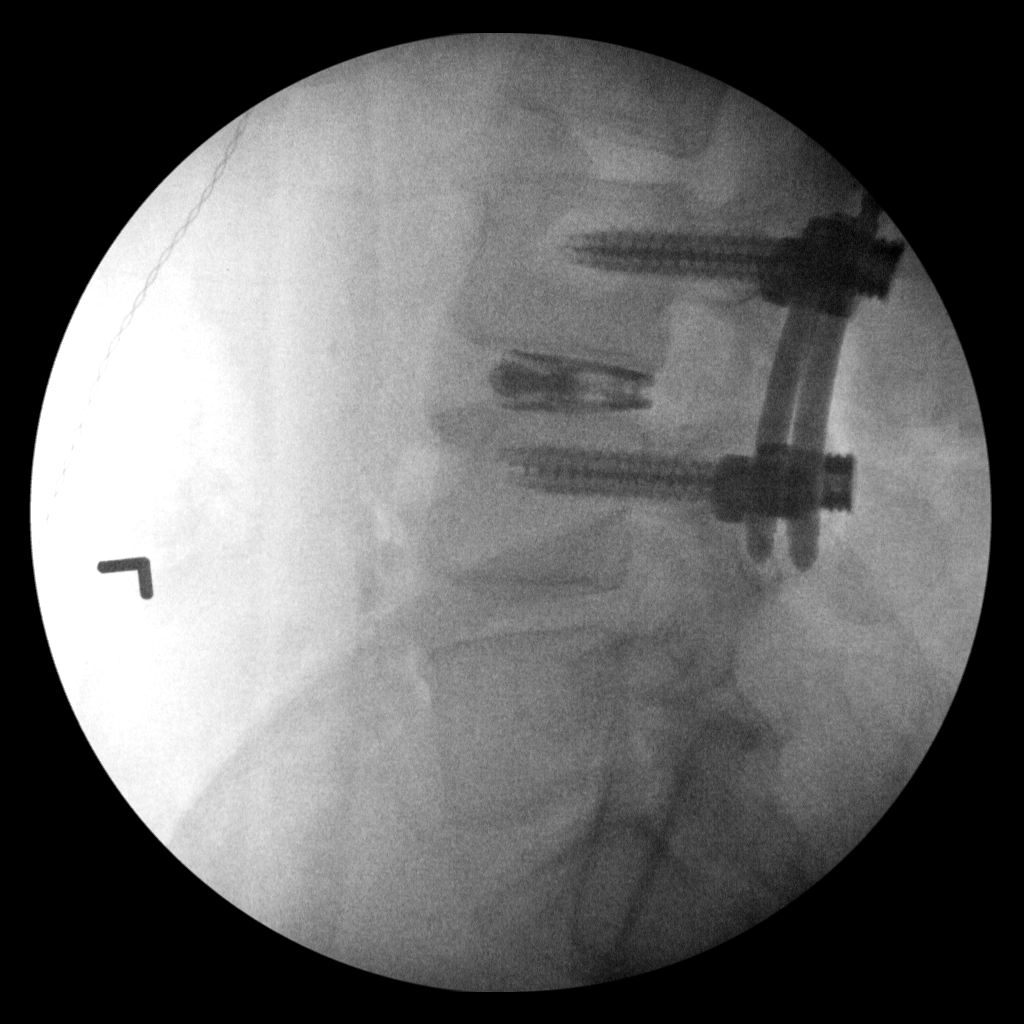
[im 2/4]
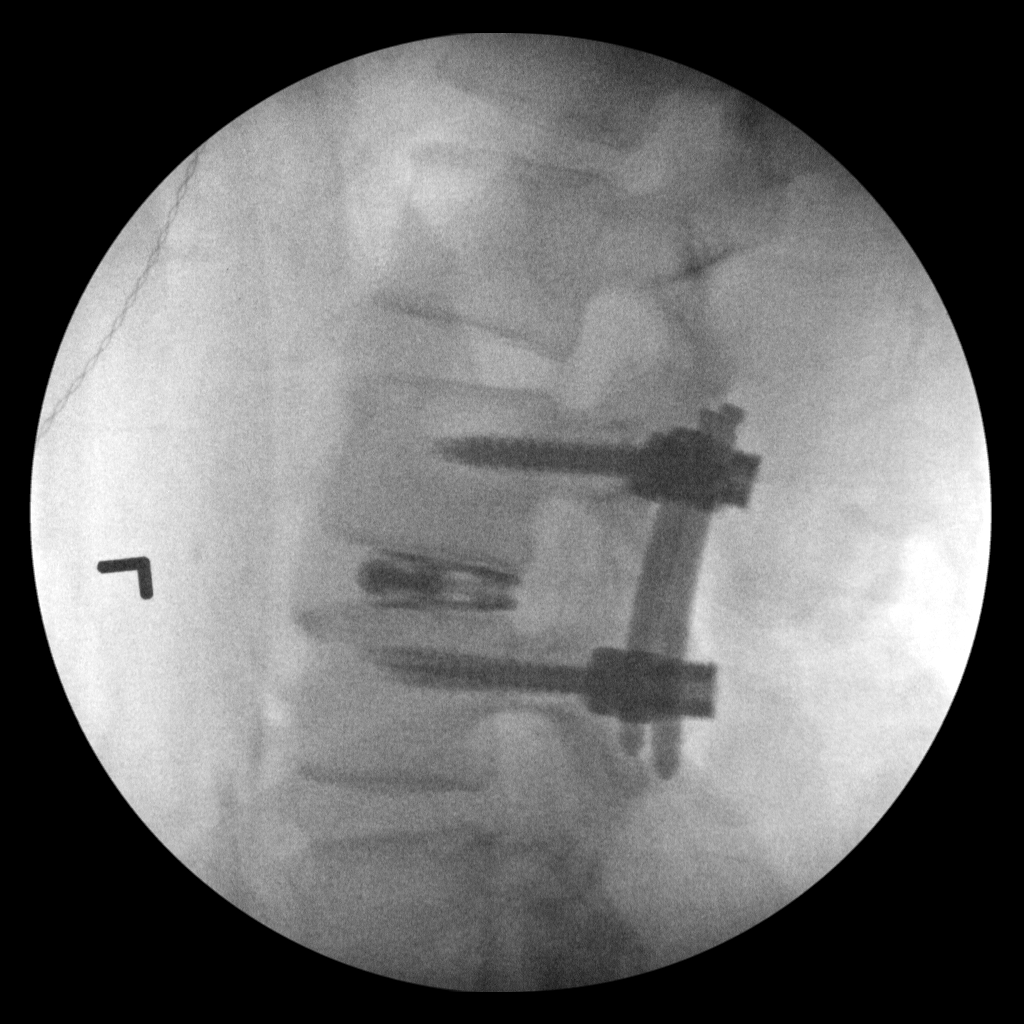
[im 3/4]
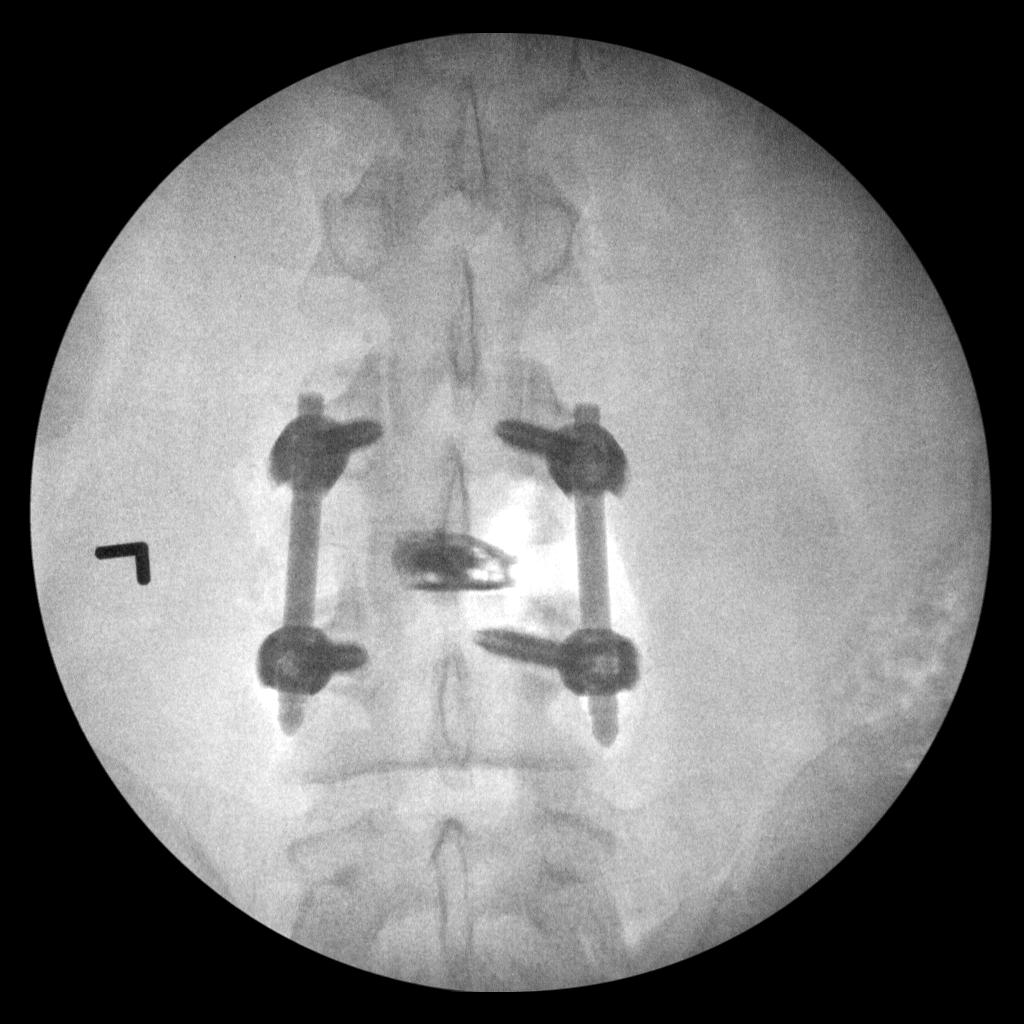
[im 4/4]
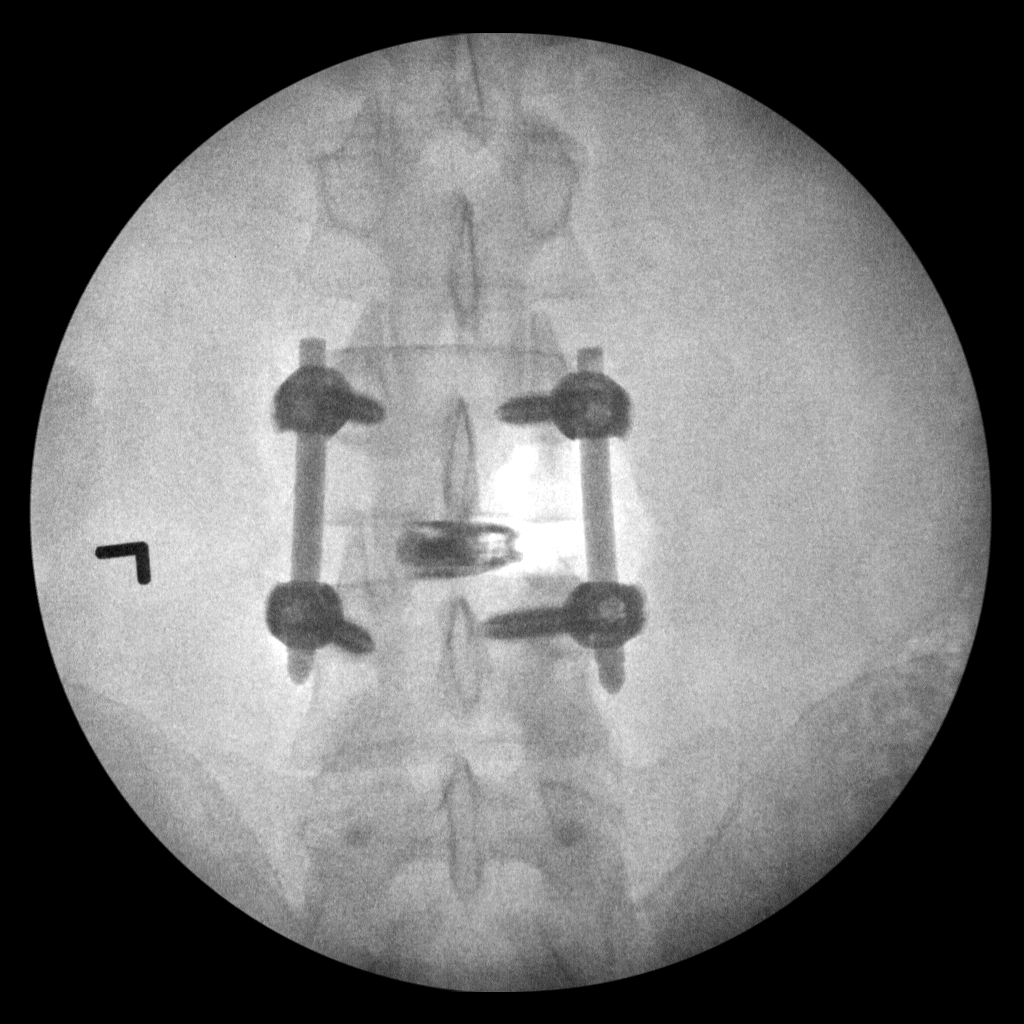

[4 of 4 positions shown; findings below may reference images not displayed]

FLUOROSCOPY TIME:  Fluoroscopy Time:  3 minutes 26 seconds

Radiation Exposure Index (if provided by the fluoroscopic device):
Not available

Number of Acquired Spot Images: 4
FINDINGS: Interbody fusion is noted at L3-4 with pedicle screws and posterior
fixation.
IMPRESSION: L3-4 TLIF
